# Patient Record
Sex: Male | Born: 1975 | Race: White | Hispanic: No | Marital: Married | Smoking: Never smoker
Health system: Southern US, Community
[De-identification: ages and names within clinical notes are randomized; demographics above are authoritative.]

## PROBLEM LIST (undated history)

## (undated) ENCOUNTER — Emergency Department (HOSPITAL_COMMUNITY): Admission: EM | Payer: BC Managed Care – PPO | Source: Home / Self Care

## (undated) DIAGNOSIS — K625 Hemorrhage of anus and rectum: Secondary | ICD-10-CM

## (undated) DIAGNOSIS — E079 Disorder of thyroid, unspecified: Secondary | ICD-10-CM

## (undated) DIAGNOSIS — K5150 Left sided colitis without complications: Secondary | ICD-10-CM

## (undated) DIAGNOSIS — E0590 Thyrotoxicosis, unspecified without thyrotoxic crisis or storm: Secondary | ICD-10-CM

## (undated) DIAGNOSIS — K449 Diaphragmatic hernia without obstruction or gangrene: Secondary | ICD-10-CM

## (undated) DIAGNOSIS — K219 Gastro-esophageal reflux disease without esophagitis: Secondary | ICD-10-CM

## (undated) HISTORY — DX: Disorder of thyroid, unspecified: E07.9

## (undated) HISTORY — PX: ACHILLES TENDON REPAIR: SUR1153

## (undated) HISTORY — PX: COLONOSCOPY: SHX174

## (undated) HISTORY — DX: Left sided colitis without complications: K51.50

## (undated) HISTORY — PX: SHOULDER SURGERY: SHX246

## (undated) HISTORY — DX: Thyrotoxicosis, unspecified without thyrotoxic crisis or storm: E05.90

## (undated) HISTORY — PX: KNEE ARTHROSCOPY: SUR90

## (undated) HISTORY — DX: Diaphragmatic hernia without obstruction or gangrene: K44.9

## (undated) HISTORY — DX: Gastro-esophageal reflux disease without esophagitis: K21.9

## (undated) HISTORY — DX: Hemorrhage of anus and rectum: K62.5

---

## 2005-03-15 ENCOUNTER — Encounter: Payer: Self-pay | Admitting: Gastroenterology

## 2005-12-13 ENCOUNTER — Encounter: Payer: Self-pay | Admitting: Gastroenterology

## 2007-01-16 ENCOUNTER — Encounter: Payer: Self-pay | Admitting: Gastroenterology

## 2007-03-13 ENCOUNTER — Encounter: Payer: Self-pay | Admitting: Gastroenterology

## 2007-04-18 ENCOUNTER — Encounter: Payer: Self-pay | Admitting: Gastroenterology

## 2007-10-01 ENCOUNTER — Telehealth: Payer: Self-pay | Admitting: Gastroenterology

## 2007-10-03 ENCOUNTER — Telehealth: Payer: Self-pay | Admitting: Gastroenterology

## 2007-10-24 DIAGNOSIS — K649 Unspecified hemorrhoids: Secondary | ICD-10-CM | POA: Insufficient documentation

## 2007-10-24 DIAGNOSIS — K5150 Left sided colitis without complications: Secondary | ICD-10-CM

## 2007-10-24 DIAGNOSIS — K449 Diaphragmatic hernia without obstruction or gangrene: Secondary | ICD-10-CM | POA: Insufficient documentation

## 2007-10-30 ENCOUNTER — Ambulatory Visit: Payer: Self-pay | Admitting: Gastroenterology

## 2007-11-06 LAB — CONVERTED CEMR LAB
AST: 47 units/L — ABNORMAL HIGH (ref 0–37)
Albumin: 4.5 g/dL (ref 3.5–5.2)
Alkaline Phosphatase: 45 units/L (ref 39–117)
BUN: 11 mg/dL (ref 6–23)
Basophils Absolute: 0.1 10*3/uL (ref 0.0–0.1)
Chloride: 101 meq/L (ref 96–112)
Eosinophils Absolute: 0.4 10*3/uL (ref 0.0–0.7)
Ferritin: 29.6 ng/mL (ref 22.0–322.0)
Folate: 11.9 ng/mL
GFR calc Af Amer: 101 mL/min
GFR calc non Af Amer: 84 mL/min
HCT: 44.6 % (ref 39.0–52.0)
MCHC: 35.3 g/dL (ref 30.0–36.0)
MCV: 92.9 fL (ref 78.0–100.0)
Monocytes Absolute: 0.4 10*3/uL (ref 0.1–1.0)
Neutrophils Relative %: 43.9 % (ref 43.0–77.0)
Platelets: 204 10*3/uL (ref 150–400)
Potassium: 4.1 meq/L (ref 3.5–5.1)
RDW: 11.8 % (ref 11.5–14.6)
Saturation Ratios: 40.1 % (ref 20.0–50.0)
Sed Rate: 6 mm/hr (ref 0–16)
Sodium: 138 meq/L (ref 135–145)
Total Bilirubin: 0.9 mg/dL (ref 0.3–1.2)
WBC: 4.8 10*3/uL (ref 4.5–10.5)

## 2007-11-12 ENCOUNTER — Encounter: Payer: Self-pay | Admitting: Gastroenterology

## 2007-11-12 ENCOUNTER — Ambulatory Visit: Payer: Self-pay | Admitting: Gastroenterology

## 2007-11-19 ENCOUNTER — Encounter (INDEPENDENT_AMBULATORY_CARE_PROVIDER_SITE_OTHER): Payer: Self-pay | Admitting: *Deleted

## 2007-12-14 ENCOUNTER — Ambulatory Visit: Payer: Self-pay | Admitting: Gastroenterology

## 2007-12-21 ENCOUNTER — Telehealth: Payer: Self-pay | Admitting: Gastroenterology

## 2008-01-02 ENCOUNTER — Ambulatory Visit: Payer: Self-pay | Admitting: Gastroenterology

## 2008-01-02 LAB — CONVERTED CEMR LAB
Basophils Absolute: 0.1 10*3/uL (ref 0.0–0.1)
Basophils Relative: 1.3 % (ref 0.0–3.0)
Eosinophils Absolute: 0.4 10*3/uL (ref 0.0–0.7)
Eosinophils Relative: 8.6 % — ABNORMAL HIGH (ref 0.0–5.0)
HCT: 43.9 % (ref 39.0–52.0)
MCHC: 35.3 g/dL (ref 30.0–36.0)
MCV: 93.1 fL (ref 78.0–100.0)
Monocytes Absolute: 0.3 10*3/uL (ref 0.1–1.0)
Platelets: 204 10*3/uL (ref 150–400)
RBC: 4.72 M/uL (ref 4.22–5.81)
WBC: 4.7 10*3/uL (ref 4.5–10.5)

## 2008-01-14 ENCOUNTER — Telehealth: Payer: Self-pay | Admitting: Gastroenterology

## 2008-01-18 ENCOUNTER — Ambulatory Visit: Payer: Self-pay | Admitting: Gastroenterology

## 2008-01-18 LAB — CONVERTED CEMR LAB
Basophils Absolute: 0.1 10*3/uL (ref 0.0–0.1)
Lymphocytes Relative: 36.9 % (ref 12.0–46.0)
MCHC: 34.8 g/dL (ref 30.0–36.0)
Monocytes Relative: 7.6 % (ref 3.0–12.0)
Neutrophils Relative %: 47.5 % (ref 43.0–77.0)
RDW: 12 % (ref 11.5–14.6)

## 2008-02-07 ENCOUNTER — Ambulatory Visit: Payer: Self-pay | Admitting: Gastroenterology

## 2008-02-07 LAB — CONVERTED CEMR LAB
ALT: 36 units/L (ref 0–53)
Albumin: 4.6 g/dL (ref 3.5–5.2)
Basophils Relative: 1 % (ref 0.0–3.0)
Bilirubin, Direct: 0.1 mg/dL (ref 0.0–0.3)
Eosinophils Relative: 6.6 % — ABNORMAL HIGH (ref 0.0–5.0)
HCT: 44.6 % (ref 39.0–52.0)
Hemoglobin: 15.8 g/dL (ref 13.0–17.0)
Monocytes Absolute: 0.4 10*3/uL (ref 0.1–1.0)
Monocytes Relative: 8.6 % (ref 3.0–12.0)
Neutro Abs: 2.3 10*3/uL (ref 1.4–7.7)
RBC: 4.73 M/uL (ref 4.22–5.81)
Total Protein: 7.7 g/dL (ref 6.0–8.3)
WBC: 4.6 10*3/uL (ref 4.5–10.5)

## 2008-03-24 ENCOUNTER — Ambulatory Visit: Payer: Self-pay | Admitting: Gastroenterology

## 2008-03-31 LAB — CONVERTED CEMR LAB
Alkaline Phosphatase: 46 units/L (ref 39–117)
Basophils Absolute: 0 10*3/uL (ref 0.0–0.1)
Bilirubin, Direct: 0.2 mg/dL (ref 0.0–0.3)
Eosinophils Absolute: 0.4 10*3/uL (ref 0.0–0.7)
Eosinophils Relative: 7.7 % — ABNORMAL HIGH (ref 0.0–5.0)
MCV: 96.1 fL (ref 78.0–100.0)
Neutrophils Relative %: 53.5 % (ref 43.0–77.0)
Platelets: 192 10*3/uL (ref 150–400)
Total Protein: 7.1 g/dL (ref 6.0–8.3)
WBC: 5.3 10*3/uL (ref 4.5–10.5)

## 2008-04-08 ENCOUNTER — Ambulatory Visit: Payer: Self-pay | Admitting: Gastroenterology

## 2008-04-14 ENCOUNTER — Telehealth: Payer: Self-pay | Admitting: Gastroenterology

## 2008-04-15 ENCOUNTER — Encounter: Payer: Self-pay | Admitting: Gastroenterology

## 2008-04-17 ENCOUNTER — Ambulatory Visit: Payer: Self-pay | Admitting: Gastroenterology

## 2008-04-23 ENCOUNTER — Ambulatory Visit: Payer: Self-pay | Admitting: Gastroenterology

## 2008-04-23 ENCOUNTER — Encounter: Payer: Self-pay | Admitting: Gastroenterology

## 2008-04-25 ENCOUNTER — Encounter: Payer: Self-pay | Admitting: Gastroenterology

## 2008-04-29 ENCOUNTER — Telehealth: Payer: Self-pay | Admitting: Gastroenterology

## 2008-05-01 ENCOUNTER — Ambulatory Visit: Payer: Self-pay | Admitting: Gastroenterology

## 2008-05-07 ENCOUNTER — Ambulatory Visit: Payer: Self-pay | Admitting: Gastroenterology

## 2008-05-07 ENCOUNTER — Telehealth: Payer: Self-pay | Admitting: Gastroenterology

## 2008-05-15 ENCOUNTER — Telehealth: Payer: Self-pay | Admitting: Gastroenterology

## 2008-06-09 ENCOUNTER — Ambulatory Visit: Payer: Self-pay | Admitting: Gastroenterology

## 2008-06-09 LAB — CONVERTED CEMR LAB
Albumin: 4 g/dL (ref 3.5–5.2)
Basophils Relative: 0.1 % (ref 0.0–3.0)
Eosinophils Absolute: 0 10*3/uL (ref 0.0–0.7)
Hemoglobin: 15.2 g/dL (ref 13.0–17.0)
Lymphs Abs: 0.5 10*3/uL — ABNORMAL LOW (ref 0.7–4.0)
MCHC: 35.3 g/dL (ref 30.0–36.0)
MCV: 98.4 fL (ref 78.0–100.0)
Monocytes Absolute: 0.2 10*3/uL (ref 0.1–1.0)
Neutro Abs: 4.3 10*3/uL (ref 1.4–7.7)
RBC: 4.38 M/uL (ref 4.22–5.81)
Total Protein: 6.7 g/dL (ref 6.0–8.3)

## 2008-06-12 ENCOUNTER — Ambulatory Visit: Payer: Self-pay | Admitting: Gastroenterology

## 2008-07-09 ENCOUNTER — Telehealth: Payer: Self-pay | Admitting: Gastroenterology

## 2008-07-11 ENCOUNTER — Telehealth: Payer: Self-pay | Admitting: Gastroenterology

## 2008-07-24 ENCOUNTER — Telehealth: Payer: Self-pay | Admitting: Gastroenterology

## 2008-08-11 ENCOUNTER — Telehealth: Payer: Self-pay | Admitting: Gastroenterology

## 2008-08-12 ENCOUNTER — Ambulatory Visit: Payer: Self-pay | Admitting: Gastroenterology

## 2008-08-12 DIAGNOSIS — K625 Hemorrhage of anus and rectum: Secondary | ICD-10-CM | POA: Insufficient documentation

## 2008-08-12 LAB — CONVERTED CEMR LAB
Basophils Absolute: 0.1 10*3/uL (ref 0.0–0.1)
Basophils Relative: 1.5 % (ref 0.0–3.0)
Eosinophils Absolute: 0.3 10*3/uL (ref 0.0–0.7)
Ferritin: 41.3 ng/mL (ref 22.0–322.0)
HCT: 42.8 % (ref 39.0–52.0)
Hemoglobin: 15.2 g/dL (ref 13.0–17.0)
Lymphs Abs: 1.1 10*3/uL (ref 0.7–4.0)
MCHC: 35.6 g/dL (ref 30.0–36.0)
Monocytes Relative: 11.7 % (ref 3.0–12.0)
Neutro Abs: 2.1 10*3/uL (ref 1.4–7.7)
RDW: 11.4 % — ABNORMAL LOW (ref 11.5–14.6)
Vitamin B-12: 718 pg/mL (ref 211–911)

## 2008-08-18 ENCOUNTER — Telehealth: Payer: Self-pay | Admitting: Gastroenterology

## 2008-08-19 ENCOUNTER — Encounter: Admission: RE | Admit: 2008-08-19 | Discharge: 2008-11-17 | Payer: Self-pay | Admitting: Gastroenterology

## 2008-08-19 ENCOUNTER — Encounter: Payer: Self-pay | Admitting: Gastroenterology

## 2008-08-30 ENCOUNTER — Encounter: Payer: Self-pay | Admitting: Gastroenterology

## 2008-09-16 ENCOUNTER — Ambulatory Visit: Payer: Self-pay | Admitting: Gastroenterology

## 2008-09-16 LAB — CONVERTED CEMR LAB
Albumin: 4 g/dL (ref 3.5–5.2)
Alkaline Phosphatase: 35 units/L — ABNORMAL LOW (ref 39–117)
Basophils Absolute: 0.1 10*3/uL (ref 0.0–0.1)
Eosinophils Absolute: 0.1 10*3/uL (ref 0.0–0.7)
Hemoglobin: 15.4 g/dL (ref 13.0–17.0)
Lymphocytes Relative: 40.2 % (ref 12.0–46.0)
Lymphs Abs: 2.4 10*3/uL (ref 0.7–4.0)
MCHC: 33.8 g/dL (ref 30.0–36.0)
MCV: 98.2 fL (ref 78.0–100.0)
Monocytes Absolute: 0.4 10*3/uL (ref 0.1–1.0)
Neutro Abs: 2.9 10*3/uL (ref 1.4–7.7)
RDW: 11.9 % (ref 11.5–14.6)
Sed Rate: 4 mm/hr (ref 0–22)
Total Protein: 6.7 g/dL (ref 6.0–8.3)

## 2008-10-09 ENCOUNTER — Ambulatory Visit: Payer: Self-pay | Admitting: Gastroenterology

## 2008-10-09 LAB — CONVERTED CEMR LAB
AST: 23 units/L (ref 0–37)
Albumin: 4.2 g/dL (ref 3.5–5.2)
Alkaline Phosphatase: 37 units/L — ABNORMAL LOW (ref 39–117)
Basophils Relative: 0.1 % (ref 0.0–3.0)
Bilirubin, Direct: 0.1 mg/dL (ref 0.0–0.3)
Eosinophils Relative: 0.6 % (ref 0.0–5.0)
Hemoglobin: 15.5 g/dL (ref 13.0–17.0)
Lymphocytes Relative: 18.6 % (ref 12.0–46.0)
Monocytes Relative: 4.1 % (ref 3.0–12.0)
Neutro Abs: 3.5 10*3/uL (ref 1.4–7.7)
Neutrophils Relative %: 76.6 % (ref 43.0–77.0)
RBC: 4.64 M/uL (ref 4.22–5.81)
Total Protein: 6.7 g/dL (ref 6.0–8.3)
WBC: 4.5 10*3/uL (ref 4.5–10.5)

## 2008-10-21 ENCOUNTER — Ambulatory Visit: Payer: Self-pay | Admitting: Gastroenterology

## 2008-10-22 LAB — CONVERTED CEMR LAB
Basophils Absolute: 0 10*3/uL (ref 0.0–0.1)
Basophils Relative: 0.7 % (ref 0.0–3.0)
Eosinophils Absolute: 0.1 10*3/uL (ref 0.0–0.7)
Lymphocytes Relative: 38.9 % (ref 12.0–46.0)
MCHC: 34.1 g/dL (ref 30.0–36.0)
MCV: 97.6 fL (ref 78.0–100.0)
Monocytes Absolute: 0.4 10*3/uL (ref 0.1–1.0)
Neutro Abs: 2.1 10*3/uL (ref 1.4–7.7)
Neutrophils Relative %: 48.5 % (ref 43.0–77.0)
RBC: 4.61 M/uL (ref 4.22–5.81)
RDW: 13 % (ref 11.5–14.6)

## 2008-11-10 ENCOUNTER — Telehealth: Payer: Self-pay | Admitting: Gastroenterology

## 2008-11-13 ENCOUNTER — Telehealth: Payer: Self-pay | Admitting: Gastroenterology

## 2008-12-22 ENCOUNTER — Ambulatory Visit: Payer: Self-pay | Admitting: Gastroenterology

## 2008-12-22 LAB — CONVERTED CEMR LAB
Basophils Absolute: 0.1 10*3/uL (ref 0.0–0.1)
Eosinophils Absolute: 0.1 10*3/uL (ref 0.0–0.7)
Hemoglobin: 15.2 g/dL (ref 13.0–17.0)
Lymphocytes Relative: 20.1 % (ref 12.0–46.0)
MCHC: 34.3 g/dL (ref 30.0–36.0)
Monocytes Relative: 6.4 % (ref 3.0–12.0)
Neutro Abs: 3.4 10*3/uL (ref 1.4–7.7)
Neutrophils Relative %: 69.2 % (ref 43.0–77.0)
RBC: 4.48 M/uL (ref 4.22–5.81)
RDW: 12.4 % (ref 11.5–14.6)

## 2008-12-23 ENCOUNTER — Ambulatory Visit: Payer: Self-pay | Admitting: Gastroenterology

## 2009-02-12 ENCOUNTER — Encounter (INDEPENDENT_AMBULATORY_CARE_PROVIDER_SITE_OTHER): Payer: Self-pay | Admitting: *Deleted

## 2009-03-04 ENCOUNTER — Ambulatory Visit: Payer: Self-pay | Admitting: Gastroenterology

## 2009-03-04 LAB — CONVERTED CEMR LAB
Basophils Absolute: 0 10*3/uL (ref 0.0–0.1)
Basophils Relative: 0.3 % (ref 0.0–3.0)
Eosinophils Relative: 0.5 % (ref 0.0–5.0)
HCT: 45.8 % (ref 39.0–52.0)
Hemoglobin: 15.4 g/dL (ref 13.0–17.0)
Lymphocytes Relative: 11.8 % — ABNORMAL LOW (ref 12.0–46.0)
Lymphs Abs: 0.9 10*3/uL (ref 0.7–4.0)
Monocytes Relative: 4.7 % (ref 3.0–12.0)
Neutro Abs: 6.2 10*3/uL (ref 1.4–7.7)
RBC: 4.51 M/uL (ref 4.22–5.81)

## 2009-03-06 ENCOUNTER — Ambulatory Visit: Payer: Self-pay | Admitting: Gastroenterology

## 2009-03-06 ENCOUNTER — Encounter (INDEPENDENT_AMBULATORY_CARE_PROVIDER_SITE_OTHER): Payer: Self-pay | Admitting: *Deleted

## 2009-03-11 ENCOUNTER — Telehealth: Payer: Self-pay | Admitting: Gastroenterology

## 2009-03-13 ENCOUNTER — Ambulatory Visit: Payer: Self-pay | Admitting: Gastroenterology

## 2009-03-17 ENCOUNTER — Encounter: Payer: Self-pay | Admitting: Gastroenterology

## 2009-03-19 ENCOUNTER — Telehealth: Payer: Self-pay | Admitting: Gastroenterology

## 2009-03-23 ENCOUNTER — Ambulatory Visit: Payer: Self-pay | Admitting: Gastroenterology

## 2009-04-13 ENCOUNTER — Telehealth: Payer: Self-pay | Admitting: Gastroenterology

## 2009-04-16 ENCOUNTER — Ambulatory Visit: Payer: Self-pay | Admitting: Family Medicine

## 2009-04-24 ENCOUNTER — Telehealth: Payer: Self-pay | Admitting: Gastroenterology

## 2009-04-28 ENCOUNTER — Telehealth: Payer: Self-pay | Admitting: Gastroenterology

## 2009-05-11 ENCOUNTER — Telehealth: Payer: Self-pay | Admitting: Gastroenterology

## 2009-06-24 ENCOUNTER — Telehealth: Payer: Self-pay | Admitting: Gastroenterology

## 2009-07-08 ENCOUNTER — Ambulatory Visit: Payer: Self-pay | Admitting: Gastroenterology

## 2009-07-08 LAB — CONVERTED CEMR LAB
ALT: 42 units/L (ref 0–53)
Albumin: 4.7 g/dL (ref 3.5–5.2)
BUN: 21 mg/dL (ref 6–23)
Basophils Absolute: 0 10*3/uL (ref 0.0–0.1)
Basophils Relative: 0.5 % (ref 0.0–3.0)
Bilirubin, Direct: 0.1 mg/dL (ref 0.0–0.3)
CO2: 32 meq/L (ref 19–32)
Calcium: 9.4 mg/dL (ref 8.4–10.5)
Chloride: 103 meq/L (ref 96–112)
Creatinine, Ser: 1 mg/dL (ref 0.4–1.5)
Eosinophils Absolute: 0.4 10*3/uL (ref 0.0–0.7)
Glucose, Bld: 111 mg/dL — ABNORMAL HIGH (ref 70–99)
Lymphocytes Relative: 25.9 % (ref 12.0–46.0)
MCHC: 34.8 g/dL (ref 30.0–36.0)
Monocytes Relative: 5 % (ref 3.0–12.0)
Neutrophils Relative %: 63 % (ref 43.0–77.0)
RBC: 4.54 M/uL (ref 4.22–5.81)
Sed Rate: 5 mm/hr (ref 0–22)
Total Protein: 7.2 g/dL (ref 6.0–8.3)

## 2009-07-10 ENCOUNTER — Ambulatory Visit: Payer: Self-pay | Admitting: Gastroenterology

## 2009-07-24 ENCOUNTER — Telehealth: Payer: Self-pay | Admitting: Gastroenterology

## 2009-09-30 ENCOUNTER — Ambulatory Visit: Payer: Self-pay | Admitting: Gastroenterology

## 2009-09-30 LAB — CONVERTED CEMR LAB
ALT: 36 units/L (ref 0–53)
AST: 27 units/L (ref 0–37)
Bilirubin, Direct: 0.2 mg/dL (ref 0.0–0.3)
Eosinophils Relative: 8.3 % — ABNORMAL HIGH (ref 0.0–5.0)
HCT: 45.6 % (ref 39.0–52.0)
Monocytes Relative: 8.4 % (ref 3.0–12.0)
Neutrophils Relative %: 46.3 % (ref 43.0–77.0)
Platelets: 162 10*3/uL (ref 150.0–400.0)
RBC: 4.61 M/uL (ref 4.22–5.81)
Total Bilirubin: 0.9 mg/dL (ref 0.3–1.2)
WBC: 4.6 10*3/uL (ref 4.5–10.5)

## 2009-10-01 ENCOUNTER — Ambulatory Visit: Payer: Self-pay | Admitting: Gastroenterology

## 2010-01-05 ENCOUNTER — Ambulatory Visit: Payer: Self-pay | Admitting: Gastroenterology

## 2010-01-05 LAB — CONVERTED CEMR LAB
Basophils Absolute: 0.1 10*3/uL (ref 0.0–0.1)
CRP, High Sensitivity: 0.21 (ref 0.00–5.00)
Eosinophils Relative: 9.4 % — ABNORMAL HIGH (ref 0.0–5.0)
HCT: 45.7 % (ref 39.0–52.0)
Lymphs Abs: 1.9 10*3/uL (ref 0.7–4.0)
MCHC: 34.9 g/dL (ref 30.0–36.0)
MCV: 97.7 fL (ref 78.0–100.0)
Monocytes Absolute: 0.5 10*3/uL (ref 0.1–1.0)
Platelets: 180 10*3/uL (ref 150.0–400.0)
RDW: 13 % (ref 11.5–14.6)

## 2010-03-02 NOTE — Assessment & Plan Note (Signed)
Summary: TB skin test. to start Remicade/dfs  Nurse Visit   Allergies: No Known Drug Allergies  Immunizations Administered:  PPD Skin Test:    Vaccine Type: PPD    Site: right forearm    Mfr: Sanofi Pasteur    Dose: 0.1 ml    Route: ID    Given by: [name] RN    Exp. Date: 04/28/2011    Lot #: [id number]  PPD Results    Date of reading: 03/25/2009    Results: < 5mm    Interpretation: negative  Orders Added: 1)  TB Skin Test [86580] 2)  Admin 1st Vaccine [82956]

## 2010-03-02 NOTE — Procedures (Signed)
Summary: Flexible Sigmoidoscopy  Patient: Bobby Medina Note: All result statuses are Final unless otherwise noted.  Tests: (1) Flexible Sigmoidoscopy (FLX)  FLX Flexible Sigmoidoscopy                             DONE     [facility]     [address]     [place], [place]  [address]           FLEXIBLE SIGMOIDOSCOPY PROCEDURE REPORT           PATIENT:  Bobby Medina, Bobby Medina  MR#:  [id number]     BIRTHDATE:  1976/01/26, 33 yrs. old  GENDER:  male           ENDOSCOPIST:  Vania Rea. Jarold Motto, MD, [facility]     Referred by:           PROCEDURE DATE:  03/13/2009     PROCEDURE:  Flexible Sigmoidoscopy with biopsy     ASA CLASS:  Class II     INDICATIONS:  ulcerative colitis REFRACTORY U.C.           MEDICATIONS:   Fentanyl 75 mcg IV, Versed 5 mg           DESCRIPTION OF PROCEDURE:   After the risks benefits and     alternatives of the procedure were thoroughly explained, informed     consent was obtained.  VERY TENDER EXAM. The [id number]     endoscope was introduced through the anus and advanced to the     descending colon, limited by poor preparation.   The quality of     the prep was poor.  The instrument was then slowly withdrawn as     the mucosa was fully examined.     <<PROCEDUREIMAGES>>           Colitis was found. ULCERATIONS AND EXUDATE AND MARKED FRIABILITY     DIFFUSELY.BX. DONE.   Retroflexed views in the rectum revealed not     performed.    The scope was then withdrawn from the patient and     the procedure terminated.           COMPLICATIONS:  None           ENDOSCOPIC IMPRESSION:     1) Colitis     2) Not performed     REFRACTORY ULCERATIVE COLITIS.R/O CMV,PMC,ETC.     RECOMMENDATIONS:     1) await biopsy results     WILL START REMICADE INFUSIONS ASAP.           REPEAT EXAM:  No           ______________________________     Vania Rea. Jarold Motto, MD, [name]           CC:           n.     eSIGNED:   Vania Rea. Michille Mcelrath at 03/13/2009 03:37 PM        Osvaldo Shipper, [id number]  Note: An exclamation mark (!) indicates a result that was not dispersed into the flowsheet. Document Creation Date: 03/13/2009 3:38 PM _______________________________________________________________________  (1) Order result status: Final Collection or observation date-time: 03/13/2009 15:28 Requested date-time:  Receipt date-time:  Reported date-time:  Referring Physician:   Ordering Physician: Sheryn Bison [phone]) Specimen Source:  Source: Launa Grill Order Number: [id number] Lab site:   Appended Document: Flexible Sigmoidoscopy    Clinical Lists Changes  Orders: Added  new Test order of Remicade Infusion (Remicade) - Signed

## 2010-03-02 NOTE — Progress Notes (Signed)
Summary: Question for nurse  Phone Note Call from Patient   Caller: [phone] [facility] spouse Call For: DR Brigitte Soderberg Summary of Call: Wants to speak to nurse  Initial call taken by: Leanor Kail [facility],  April 28, 2009 4:16 PM  Follow-up for Phone Call        Pt is trying to compare cost of getting remicade at the hospital out patient center vs getting it at a doctor's office.   Pt has appt at [facility] next week to see Dr. Dareen Piano re the remicade infusion.  They will be able to tell pt his cost after his visit.  Wife is asking if we can find out anything about cost of infusion at [facility] short stay.  She called the billing dept and was told that she will need a code for this information.   Morrie Sheldon, can you help with this? Follow-up by: [name] RN,  April 29, 2009 9:21 AM  Additional Follow-up for Phone Call Additional follow up Details #1::        Spoke with wife, Questions answered. Additional Follow-up by: Dwan Bolt,  April 29, 2009 10:59 AM

## 2010-03-02 NOTE — Assessment & Plan Note (Signed)
Summary: UC / JCS   Vital Signs:  Patient profile:   35 year old male Height:      75 inches Weight:      190.4 pounds  Primary Care Provider:  Tally Joe, MD    History of Present Illness: Assessment: new patient UC; GI doctor has recommended he start Remicade, following sigmoidoscopy that showed significant inflammation on the L side of his colon.  He and his wife Helmut Muster would like to try everything they can do before resorting to Remicade, which he will do if diet fails.  Mr Hessel has been following the "Maker's Diet" for about 3 weeks,  which limits processed foods in general and refined carb's specifically, with an emphasis on high-quality fats.  No artificial sweeteners.    Has not done food sensitivity testing.  GI symptoms, including loose stools, blood, and mucus, have improved in the past 3 wk; not sure if diet-related or coincidence, but encouraging.    Currently on 6-Mercaptopurine (6-MP) 75mg  daily, vit D 400 mg, & calcium.  Off prednisone x 3 weeks.  Since off prednisone, notes fatigue so has limited activity.  Still playing basketball every Sunday for 2 hr, however, and fatigue is nothing like previously experienced first time going off prednisone.    Nutrition Diagnosis: Inadequate energy intake (NI-1.4) related to energy requirements as evidenced by weight loss of  ~15 lb in past 5 wk, although weight is believed to be stabilizing, and pt is adding more foods back to diet weekly.  Patient is making progress in inappropriate intake of types of carbohydrate (NI-53.3) related to sugar foods as evidenced by elimination in past 3 wk of such foods.    Intervention: See Patient Instructions.    Monitoring/Eval: Patient will keep [place] posted as to progress, and will call w/ Qs as they arise.    Allergies: No Known Drug Allergies   Impression & Recommendations:  Problem # 1:  ULCERATIVE COLITIS, LEFT SIDED (ICD-556.5) >40 minutes spent [facility].  off prednisone x 3  wks.  Orders: [facility]- Est  Level 4 (99214)  Complete Medication List: 1)  Mercaptopurine 50 Mg Tabs (Mercaptopurine) .... Take 1 and a half tablets by mouth once daily 2)  Prednisone 5 Mg Tabs (Prednisone) .... As directed 3)  Multivitamins Tabs (Multiple vitamin) .... Once daily  Patient Instructions: 1)  There is not good research to strongly recommend any type of diet for Ulcerative Colitis.  However, if a diet is working for you, as long as it is healthy/balanced, it is safe to recommend continuing. 2)  Stick to whole real foods. 3)  Low-sugar diet, low-glycemic index foods. 4)  High-quality fats - olive oil, nut and seed oils in moderation. 5)  Adequate calories. 6)  Low-fiber diet when in a flare, otherwise high-fiber diet will be better in the long run. 7)  Supplements: OMEGA 3 FAT, VITAMIN D 1000 IU (pending vit D lab results; if adequate levels, 600 IU), CALCIUM 8)  Possibly down the road - probiotics. 9)  Check your vitamin D level and call me.

## 2010-03-02 NOTE — Assessment & Plan Note (Signed)
Summary: Recheck, dfs   History of Present Illness Primary GI MD: Sheryn Bison MD FACP FAGA Primary Provider: Tally Joe, MD  Requesting Provider: n/a Chief Complaint: f/u colitis and remicade treatments. Pt states he doing very well. He had some nausea towards the end of his last treatment but it went away but no other symptoms.  History of Present Illness:   35 year old Caucasian male with diffuse altered colitis unresponsive to standard therapy including corticosteroids and 6-mercaptopurine. He began Remicade infusions and has completed his third infusion with his initiation therapy. He says he is off of prednisone at this time and is taking 6-MP 75 mg a day. He has had some mild arthralgias and low grade fever with the infusions but otherwise is doing well and denies crampy abdominal pain, diarrhea, or rectal bleeding. He is on a non-processed food diet and has lost 20 pounds in weight. He denies any systemic complaints. His had no recurrent infections, arthritis, skin rashes, or oral stomatitis.   GI Review of Systems      Denies abdominal pain, acid reflux, belching, bloating, chest pain, dysphagia with liquids, dysphagia with solids, heartburn, loss of appetite, nausea, vomiting, vomiting blood, weight loss, and  weight gain.        Denies anal fissure, black tarry stools, change in bowel habit, constipation, diarrhea, diverticulosis, fecal incontinence, heme positive stool, hemorrhoids, irritable bowel syndrome, jaundice, light color stool, liver problems, rectal bleeding, and  rectal pain.    Current Medications (verified): 1)  Mercaptopurine 50 Mg Tabs (Mercaptopurine) .... Take 1 and A Half Tablets By Mouth Once Daily 2)  Remicade 100 Mg Solr (Infliximab) .... Just Finished 3rd Treatment. Next in 8 Weeks 3)  Vitamin D 1000 Unit Tabs (Cholecalciferol) .... One Tablet By Mouth Once Daily  Allergies (verified): No Known Drug Allergies  Past History:  Past medical, surgical,  family and social histories (including risk factors) reviewed for relevance to current acute and chronic problems.  Past Medical History: Reviewed history from 08/12/2008 and no changes required. 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 Knee Arthroscopy  Family History: Reviewed history from 04/08/2008 and no changes required. Family History of Heart Disease: Grandfather (maternal) No FH of Colon Cancer:  Social History: Reviewed history from 10/30/2007 and no changes required. Patient has never smoked.  Alcohol Use - no Occupation: Management consultant Drug Use - no Patient gets regular exercise.  Review of Systems  The patient denies allergy/sinus, anemia, anxiety-new, arthritis/joint pain, back pain, blood in urine, breast changes/lumps, change in vision, confusion, cough, coughing up blood, depression-new, fainting, fatigue, fever, headaches-new, hearing problems, heart murmur, heart rhythm changes, itching, menstrual pain, muscle pains/cramps, night sweats, nosebleeds, pregnancy symptoms, shortness of breath, skin rash, sleeping problems, sore throat, swelling of feet/legs, swollen lymph glands, thirst - excessive , urination - excessive , urination changes/pain, urine leakage, vision changes, and voice change.    Vital Signs:  Patient profile:   35 year old male Height:      75 inches Weight:      186.13 pounds BMI:     23.35 Pulse rate:   66 / minute Pulse rhythm:   regular BP sitting:   124 / 72  (right arm) Cuff size:   regular  Vitals Entered By: Christie Nottingham CMA Duncan Dull) (July 10, 2009 8:51 AM)  Physical Exam  General:  Well  developed, well nourished, no acute distress.healthy appearing.   Head:  Normocephalic and atraumatic. Eyes:  PERRLA, no icterus.exam deferred to  patient's ophthalmologist.   Mouth:  No deformity or lesions, dentition normal. Neck:  Supple; no masses or thyromegaly. Lungs:  Clear throughout to auscultation. Heart:  Regular rate and rhythm; no murmurs, rubs,  or bruits. Abdomen:  Soft, nontender and nondistended. No masses, hepatosplenomegaly or hernias noted. Normal bowel sounds. Extremities:  No clubbing, cyanosis, edema or deformities noted. Neurologic:  Alert and  oriented x4;  grossly normal neurologically. Cervical Nodes:  No significant cervical adenopathy. Psych:  Alert and cooperative. Normal mood and affect.   Impression & Recommendations:  Problem # 1:  ULCERATIVE COLITIS, LEFT SIDED (ICD-556.5) Assessment Improved Continue 6-MP 75 mg a day and Remicade infusions at current bases every 8 weeks. He takes Benadryl and Zantac before his infusions, and I have suggested possible Tylenol in standard bases or arthralgias. Review of recent blood work shows normal CBC, sedimentation rate, and metabolic profile. We will check him back in 3 months time and repeat his labs and consider discontinuing 6-MP therapy pending on his clinical course and response.  Problem # 2:  RECTAL BLEEDING (ICD-569.3) Assessment: Improved  Problem # 3:  HEMORRHOIDS (ICD-455.6) Assessment: Improved  Patient Instructions: 1)  Please continue current medications.  2)  Please schedule a follow-up appointment in 3 months. 3)  Please come in a few days before OV for lab work. 4)  The medication list was reviewed and reconciled.  All changed / newly prescribed medications were explained.  A complete medication list was provided to the patient / caregiver. 5)  Copy sent to : Dr. Tally Joe

## 2010-03-02 NOTE — Progress Notes (Signed)
Summary: TB skin test   Phone Note Call from Patient Call back at [phone]   Caller: wife, Elease Hashimoto Call For: Dr. Jarold Motto Reason for Call: Talk to Nurse Summary of Call: wife has questions regarding pt's TB skin test... did not want to go into further detail with me Initial call taken by: Vallarie Mare,  March 19, 2009 1:43 PM  Follow-up for Phone Call        Spoke with wife.  Pt has decided to go ahead and start the process for taking the remicade.  Will come by Monday 03/23/09 for Tb skin test.   Wife asks if pt has ever been tested for celiac and lactose interolance.  Celiac screen was done in 2009.   Wife informed of this.  She asks if there is a test for lactose intolerance? If so can pt have this done? Follow-up by: [name] RN,  March 19, 2009 3:16 PM  Additional Follow-up for Phone Call Additional follow up Details #1::        no Additional Follow-up by: [name] FACG,  March 20, 2009 8:37 AM    Additional Follow-up for Phone Call Additional follow up Details #2::    Had long conversation with patient.  He does not want referral.  He does not want allergy testing.  Pt wants to go ahead and begin the process for starting Remicaid.  TB test applied today.  Pt will return on Wed to have it read.   Will check with pt's insurance re coverage for remicaid. Follow-up by: [name] RN,  March 23, 2009 2:32 PM

## 2010-03-02 NOTE — Letter (Signed)
Summary: Office Visit Letter  [facility]  [address] [place], [place], [place] [address]   Phone: [phone]  Fax: [phone]      February 12, 2009 MRN: [id number]   [facility] [address] [place], [place]  [address]   Dear Mr. FAISON,   According to our records, it is time for you to schedule a follow-up office visit with [place].   At your convenience, please call [phone] (option #2)to schedule an office visit. If you have any questions, concerns, or feel that this letter is in error, we would appreciate your call.   Sincerely,  Vania Rea. Jarold Motto, M.D.  [facility] [phone]

## 2010-03-02 NOTE — Assessment & Plan Note (Signed)
Summary: 35-MONTH F/U APPT...LSW.   History of Present Illness Primary GI MD: Sheryn Bison MD FACP FAGA Primary Provider: Tally Joe, MD  Requesting Provider: n/a Chief Complaint: f/u ulcerative colitis and remicade treatments. Pt states he is doing very well.  History of Present Illness:   He denies abdominal pain, diarrhea, rectal bleeding, or systemic complaints. Lab data has all been normal. He takes 6-MP 75 mg a day Remicade infusions every 3 months.   GI Review of Systems      Denies abdominal pain, acid reflux, belching, bloating, chest pain, dysphagia with liquids, dysphagia with solids, heartburn, loss of appetite, nausea, vomiting, vomiting blood, weight loss, and  weight gain.        Denies anal fissure, black tarry stools, change in bowel habit, constipation, diarrhea, diverticulosis, fecal incontinence, heme positive stool, hemorrhoids, irritable bowel syndrome, jaundice, light color stool, liver problems, rectal bleeding, and  rectal pain.    Current Medications (verified): 1)  Mercaptopurine 50 Mg Tabs (Mercaptopurine) .... Take 1 and A Half Tablets By Mouth Once Daily 2)  Remicade 100 Mg Solr (Infliximab) .... Every 8 Weeks 3)  Vitamin D 1000 Unit Tabs (Cholecalciferol) .... One Tablet By Mouth Once Daily  Allergies (verified): No Known Drug Allergies  Past History:  Past medical, surgical, family and social histories (including risk factors) reviewed for relevance to current acute and chronic problems.  Past Medical History: Reviewed history from 08/12/2008 and no changes required. 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 Knee Arthroscopy  Family History: Reviewed history from 04/08/2008 and no changes required. Family History of Heart Disease: Grandfather  (maternal) No FH of Colon Cancer:  Social History: Reviewed history from 10/30/2007 and no changes required. Patient has never smoked.  Alcohol Use - no Occupation: Management consultant Drug Use - no Patient gets regular exercise.  Review of Systems  The patient denies allergy/sinus, anemia, anxiety-new, arthritis/joint pain, back pain, blood in urine, breast changes/lumps, change in vision, confusion, cough, coughing up blood, depression-new, fainting, fatigue, fever, headaches-new, hearing problems, heart murmur, heart rhythm changes, itching, menstrual pain, muscle pains/cramps, night sweats, nosebleeds, pregnancy symptoms, shortness of breath, skin rash, sleeping problems, sore throat, swelling of feet/legs, swollen lymph glands, thirst - excessive , urination - excessive , urination changes/pain, urine leakage, vision changes, and voice change.    Vital Signs:  Patient profile:   35 year old male Height:      75 inches Weight:      195.13 pounds BMI:     24.48 Pulse rate:   70 / minute Pulse rhythm:   regular BP sitting:   122 / 78  (right arm) Cuff size:   regular  Vitals Entered By: Christie Nottingham CMA Duncan Dull) (October 01, 2009 9:47 AM)  Physical Exam  General:  Well developed, well nourished, no acute distress.healthy appearing.   Head:  Normocephalic and atraumatic. Eyes:  PERRLA, no icterus.exam deferred to patient's ophthalmologist.   Lungs:  Clear throughout to auscultation. Heart:  Regular rate and rhythm; no murmurs, rubs,  or bruits. Abdomen:  Soft, nontender and nondistended. No masses, hepatosplenomegaly or hernias noted. Normal bowel sounds. Extremities:  No clubbing, cyanosis, edema or deformities noted. Neurologic:  Alert and  oriented x4;  grossly normal neurologically. Psych:  Alert and cooperative. Normal mood and affect.   Impression & Recommendations:  Problem # 1:  ULCERATIVE COLITIS, LEFT SIDED (ICD-556.5) Assessment Improved Continue  current regime with followup CBC in 3 months in followup office visit in 6 months. He of course is to call should he have a relapse of his symptoms.  Problem # 2:  HEMORRHOIDS (ICD-455.6) Assessment: Improved  Patient Instructions: 1)  Copy sent to : Tally Joe, MD  2)  The medication list was reviewed and reconciled.  All changed / newly prescribed medications were explained.  A complete medication list was provided to the patient / caregiver. 3)  Please continue current medications.  4)  Please schedule a follow-up appointment in 6 months.

## 2010-03-02 NOTE — Letter (Signed)
Summary: [facility]  [address] [place], [place] [address]   Phone: [phone]  Fax: [phone]       Unknown Bobby Medina    02/19/1975    MRN: [id number]        Procedure Day /Date: Friday, 03/13/09     Arrival Time: 2:30      Procedure Time: 3:30     Location of Procedure:                    _ _  [facility] (4th Floor)    PREPARATION FOR FLEXIBLE SIGMOIDOSCOPY WITH MAGNESIUM CITRATE  Prior to the day before your procedure, purchase one 8 oz. bottle of Magnesium Citrate and one Fleet Enema from the laxative section of your drugstore.  _________________________________________________________________________________________________  THE DAY BEFORE YOUR PROCEDURE             DATE: 03/12/09      DAY: Thursday  1.   Have a clear liquid dinner the night before your procedure.  2.   Do not drink anything colored red or purple.  Avoid juices with pulp.  No orange juice.              CLEAR LIQUIDS INCLUDE: Water Jello Ice Popsicles Tea (sugar ok, no milk/cream) Powdered fruit flavored drinks Coffee (sugar ok, no milk/cream) Gatorade Juice: apple, white grape, white cranberry  Lemonade Clear bullion, consomm, broth Carbonated beverages (any kind) Strained chicken noodle soup Hard Candy   3.   At 7:00 pm the night before your procedure, drink one bottle of Magnesium Citrate over ice.  4.   Drink at least 3 more glasses of clear liquids before bedtime (preferably juices).  5.   Results are expected usually within 1 to 6 hours after taking the Magnesium Citrate.  ___________________________________________________________________________________________________  THE DAY OF YOUR PROCEDURE            DATE:  03/13/09   DAY: Friday  1.   Use Fleet Enema one hour prior to coming for procedure.  2.   You may drink clear liquids until 1:30 (2 hours before exam)       MEDICATION INSTRUCTIONS  Unless otherwise instructed,  you should take regular prescription medications with a small sip of water as early as possible the morning of your procedure.                 OTHER INSTRUCTIONS  You will need a responsible adult at least 33 years of age to accompany you and drive you home.   This person must remain in the waiting room during your procedure.  Wear loose fitting clothing that is easily removed.  Leave jewelry and other valuables at home.  However, you may wish to bring a book to read or an iPod/MP3 player to listen to music as you wait for your procedure to start.  Remove all body piercing jewelry and leave at home.  Total time from sign-in until discharge is approximately 2-3 hours.  You should go home directly after your procedure and rest.  You can resume normal activities the day after your procedure.  The day of your procedure you should not:   Drive   Make legal decisions   Operate machinery   Drink alcohol   Return to work  You will receive specific instructions about eating, activities and medications before you leave.   The above instructions have been reviewed and explained to me by  _______________________    I fully understand and can verbalize these instructions _____________________________ Date _________

## 2010-03-02 NOTE — Progress Notes (Signed)
Summary: Remicade  Phone Note Call from Patient Call back at [phone]   Caller: wife,[name] Call For: Dr. Jarold Motto Reason for Call: Talk to Nurse Summary of Call: would like to know more about how Remicade is administered Initial call taken by: Vallarie Mare,  March 11, 2009 11:09 AM  Follow-up for Phone Call        Left message. [name] RN  March 11, 2009 11:46 AM  Pt having proc this pm. [name] RN  March 13, 2009 2:37 PM  Pt needs start Remicade infisions per Dr. Jarold Motto.  5 mg /Kg. at weeks 0,2,6 then q 8 weeks.  Please notify pt next week per Dr. Jarold Motto. Follow-up by: [name] RN,  March 13, 2009 4:30 PM  Additional Follow-up for Phone Call Additional follow up Details #1::        Talked with pt's wife.  Pt and wife have some questions re Remicade.  Before starting treatments pt is interested in trying a drastic diet change.  Has read information re this and would like to put off Remicade until he tries this for a couple of weeks.  Pt will come by for up to date TB skin test and does want [place] to go ahead and see about insurance coverage for the remicade.  Also pt is interested in being tested for food allergys.   Pt is willing to come for OV if Dr. Jarold Motto wants to talk to pt re these things.   Additional Follow-up by: [name] RN,  March 16, 2009 9:39 AM    Additional Follow-up for Phone Call Additional follow up Details #2::    HE NEEDS ANOTHER GI DOC...I DO NOT APPROVE OF HOLISTIC TREATMENTS HERE AND I DISCUSSED THIS IN DETAIL WITH HE AND HIS WIFE AND EVEN HAD SECOND OPINION PER DR. PLEVY AT [place].SHE CAN CHOOSE , BUT I FEEL IT IS BEST IF THEY SEEK CARE WITH ANOTHER GI DOCTOR....DRP Follow-up by: [name] [name],  March 16, 2009 10:30 AM  Additional Follow-up for Phone Call Additional follow up Details #3:: Details for Additional Follow-up Action Taken: Talked with wife.  She will discuss with pt and call us  back re decision wheather to start remicaid or go for referral. Additional Follow-up by: [name] RN,  March 18, 2009 9:50 AM

## 2010-03-02 NOTE — Progress Notes (Signed)
Summary: speak to nurse  Phone Note Call from Patient Call back at [phone]   Caller: Patient Call For: Jarold Motto Reason for Call: Talk to Nurse Summary of Call: Patient wants to speak to nurse regarding TB test records, states that office we reffered him to for his Remicade does not have the records yet. Initial call taken by: Tawni Levy,  May 11, 2009 12:14 PM  Follow-up for Phone Call        Needs recent TB skin test results faxed to [facility].   Done as requested. Follow-up by: [name] RN,  May 11, 2009 12:40 PM

## 2010-03-02 NOTE — Progress Notes (Signed)
Summary: ? referral   Phone Note Call from Patient Call back at [phone]   Caller: Bobby Medina  Call For: Bobby Medina  Reason for Call: Talk to Nurse, Referral Summary of Call: needs a referral from nutritionist  Initial call taken by: Guadlupe [facility],  July 24, 2008 12:54 PM  Follow-up for Phone Call        refaxed records and left message on patients wifes VM that I refaxed notes and gave her the number for nutriation management.  Follow-up by: Harlow Mares CMA,  July 24, 2008 2:55 PM

## 2010-03-02 NOTE — Progress Notes (Signed)
Summary: triage  Phone Note Call from Patient Call back at [phone]   Caller: Patient Call For: Dr. Jarold Motto Reason for Call: Talk to Nurse Summary of Call: reporting flu-like symptoms once a month Initial call taken by: Vallarie Mare,  Jun 24, 2009 10:28 AM  Follow-up for Phone Call        PREPt is taking Remicade at Rematology office.  Dr. Dareen Piano.  He has had 2 infusions and is sur of the third one on 07/09/09.  States feels great.  Bowels are normal.  No bleeding or dairrhea.  Pt was instructed by Dr. Dareen Piano to see Dr Jarold Motto after 3rd infusion.    Pt states that he has experienced several occasions where he feels chilled and has body aches.  This occurred twice before starting remicade.  Happened again last week, symptoms only lasted a few hours then felt normal.   Did not check temp last week, but during the second spell temp was 100.  Pt is on 6 mp 75 mg also. Follow-up by: [name] RN,  Jun 24, 2009 11:07 AM  Additional Follow-up for Phone Call Additional follow up Details #1::        ADVIL 800MG  BEFORE INFUSION IS OK Additional Follow-up by: [name] [name],  Jun 24, 2009 11:32 AM    Additional Follow-up for Phone Call Additional follow up Details #2::    Pt notified.   Follow up appt sch,  Does pt need lab prior to visit?  If so what labs are needed? Follow-up by: [name] RN,  Jun 24, 2009 11:51 AM  Additional Follow-up for Phone Call Additional follow up Details #3:: Details for Additional Follow-up Action Taken:  [employer] AND SR Additional Follow-up by: [name] [name],  Jun 24, 2009 12:19 PM

## 2010-03-02 NOTE — Assessment & Plan Note (Signed)
Summary: 3 MO F/U.Marland KitchenMarland KitchenAS.   History of Present Illness Visit Type: Follow-up Visit Primary GI MD: Sheryn Bison MD FACP FAGA Primary Provider: Tally Joe, MD  Requesting Provider: n/a Chief Complaint: F/u for ulcerative colitis Pt c/o loose stools and BRB in stool after BMs History of Present Illness:   Audrick has never been able totally dull of his prednisone and is currently on 10 mg a day caused continued mucus and heme with his morning bowel movement. We have adjusted his 6-MP dosage to 75 mg a day per his CBC results. He generally feels well and denies systemic complaints and is having 1-2 soft bowel movements a day without abdominal pain, fever, chills, skin rashes, joint pains, mouth sores etc. he received a promotion at work, continues to workout daily, and generally feels fairly good but fatigue He denies cardiopulmonary problems or recurrent infections, he did not have the flu this year. Previous tuberculin skin test was negative   GI Review of Systems      Denies abdominal pain, acid reflux, belching, bloating, chest pain, dysphagia with liquids, dysphagia with solids, heartburn, loss of appetite, nausea, vomiting, vomiting blood, weight loss, and  weight gain.      Reports rectal bleeding.     Denies anal fissure, black tarry stools, change in bowel habit, constipation, diarrhea, diverticulosis, fecal incontinence, heme positive stool, hemorrhoids, irritable bowel syndrome, jaundice, light color stool, liver problems, and  rectal pain.    Current Medications (verified): 1)  Mercaptopurine 50 Mg Tabs (Mercaptopurine) .... Take 1 and A Half Tablets By Mouth Once Daily 2)  Prednisone 5 Mg Tabs (Prednisone) .... As Directed 3)  Multivitamins   Tabs (Multiple Vitamin) .... Once Daily  Allergies (verified): No Known Drug Allergies  Past History:  Past medical, surgical, family and social histories (including risk factors) reviewed for relevance to current acute and chronic  problems.  Past Medical History: Reviewed history from 08/12/2008 and no changes required. 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 Knee Arthroscopy  Family History: Reviewed history from 04/08/2008 and no changes required. Family History of Heart Disease: Grandfather (maternal) No FH of Colon Cancer:  Social History: Reviewed history from 10/30/2007 and no changes required. Patient has never smoked.  Alcohol Use - no Occupation: Management consultant Drug Use - no Patient gets regular exercise.  Review of Systems       The patient complains of fatigue.  The patient denies allergy/sinus, anemia, anxiety-new, arthritis/joint pain, back pain, blood in urine, breast changes/lumps, change in vision, confusion, cough, coughing up blood, depression-new, fainting, fever, headaches-new, hearing problems, heart murmur, heart rhythm changes, itching, muscle pains/cramps, night sweats, nosebleeds, shortness of breath, skin rash, sleeping problems, sore throat, swelling of feet/legs, swollen lymph glands, thirst - excessive, urination - excessive, urination changes/pain, urine leakage, vision changes, and voice change.    Vital Signs:  Patient profile:   35 year old male Height:      75 inches Weight:      206 pounds BMI:     25.84 BSA:     2.22 Pulse rate:   76 / minute Pulse rhythm:   regular BP sitting:   112 / 68  (left arm) Cuff size:   regular  Vitals Entered By: Ok Anis CMA (March 06, 2009 9:43 AM)  Physical Exam  General:  Well developed, well nourished,  no acute distress.healthy appearing.   Head:  Normocephalic and atraumatic. Eyes:  PERRLA, no icterus.exam deferred to patient's ophthalmologist.   Lungs:  Clear throughout to auscultation. Heart:  Regular rate and rhythm; no  murmurs, rubs,  or bruits. Abdomen:  Soft, nontender and nondistended. No masses, hepatosplenomegaly or hernias noted. Normal bowel sounds. Rectal:  Normal exam.Digital exam is unremarkable but there is blood and mucus in the rectal vault.There is a posterior lateral external hemorrhoid visible which is nonbleeding. I cannot appreciate fissures or fistulae. Extremities:  No clubbing, cyanosis, edema or deformities noted. Neurologic:  Alert and  oriented x4;  grossly normal neurologically. Inguinal Nodes:  No significant inguinal adenopathy. Psych:  Alert and cooperative. Normal mood and affect.   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 Previous treatment with oral and topical amino salicylates resulted in mucosal hypersensitivity. He has been seen in consultation by Dr. Clovis Cao at [facility] in [place] for his refractory colitis. Dr. Karel Jarvis is suggested Remicade infusion should be continued to be symptomatic with active colitis. I have scheduled flexible sigmoidoscopy exam next week and we'll proceed accordingly. I given him information concerning Remicade infusions for review. For now we will continue all medications as listed in his chart.  Orders: Flex with Sedation (Flex w/Sed)  Problem # 2:  HIATAL HERNIA (ICD-553.3) Assessment: Improved No Upper gastrointestinal or hepatobiliary complaints at this time.  Patient Instructions: 1)  Copy sent to : Dr. Tally Joe 2)  Please continue current medications.  3)  Colonoscopy and Flexible Sigmoidoscopy brochure given.  4)  Conscious Sedation brochure given.  5)  Flex sig scheduled.  6)  Information given re Remicade. 7)  The medication list was reviewed and reconciled.  All changed / newly prescribed medications were explained.  A complete medication list was provided  to the patient / caregiver.

## 2010-03-02 NOTE — Progress Notes (Signed)
Summary: refill  Phone Note Call from Patient Call back at [phone]   Caller: Spouse [name] Call For: Bobby Medina Reason for Call: Talk to Nurse Summary of Call: Patient wants update to his rx refills for 6mp states that they're gong out of town tomorrow and they need refill called in today. Initial call taken by: Tawni Levy,  July 24, 2009 12:26 PM  Follow-up for Phone Call        LM for wife that Rx has been sent to pharmacy. Follow-up by: [name] RN,  July 24, 2009 12:57 PM    Prescriptions: MERCAPTOPURINE 50 MG TABS (MERCAPTOPURINE) take 1 and a half tablets by mouth once daily  #60.0 Each x 6   Entered by:   [name] RN   Authorized by:   [name] [facility]   Signed by:   [name] RN on 07/24/2009   Method used:   Electronically to        [facility] [address] [id number] (retail)       [address]       [place], [place]  [address]       Ph: [phone]       Fax: [phone]   RxID:   [id number]

## 2010-03-02 NOTE — Progress Notes (Signed)
Summary: Talk about Remocade tx with nurse  Phone Note Call from Patient   Caller: Elease Hashimoto -spouse [phone] Call For: Dr  Jarold Motto Summary of Call: Wants to talk about the remicade treatment with nurse please Initial call taken by: Leanor Kail [facility],  April 13, 2009 3:25 PM  Follow-up for Phone Call        talked with pt's wife.  They have checked with insurance re the amt that pt will have to pay to get the remicaid.  Pt asks if there is a faciity that gives IV meds instead of a hospital.  He would have to pay less out of pocket if not given in hospital setting.   Follow-up by: [name] RN,  April 13, 2009 3:43 PM  Additional Follow-up for Phone Call Additional follow up Details #1::        Remicade can be given at [facility].  Pt would need to have visit with rheumatologist first.  Dr. Dareen Piano or Dr. Mallie Mussel.  fax records to [phone], their office will verify insurance coverage and sch OV for pt.   [name] RN  April 13, 2009 4:08 PM    Additional Follow-up for Phone Call Additional follow up Details #2::    Wife notified.  She will discuss with pt and call us back if pt wants to proceed with Remicaid. Follow-up by: [name] RN,  April 14, 2009 2:38 PM

## 2010-03-02 NOTE — Progress Notes (Signed)
Summary: Needs a phone number from [facility]  Phone Note Call from Patient   Caller: Dianne Dun [phone] Call For: Dr Jarold Motto Reason for Call: Talk to Nurse Summary of Call: Needs phone number for [facility] that administers the medicine  Initial call taken by: Leanor Kail [facility],  April 24, 2009 3:56 PM  Follow-up for Phone Call        Pt is trying to compare cost of getting Remicade at [facility] short stay and at [facility] inter med.  REq that we go ahead anf fax records to [facility] for their review.   Also req phone number for short stay at [facility].  Number given. Follow-up by: [name] RN,  April 24, 2009 4:06 PM  Additional Follow-up for Phone Call Additional follow up Details #1::        Records faxed. Additional Follow-up by: [name] RN,  April 24, 2009 4:12 PM

## 2010-03-02 NOTE — Letter (Signed)
Summary: Patient Notice- Colon Biospy Results  [facility]  [address] [place], [place] [address]   Phone: [phone]  Fax: [phone]        March 17, 2009 MRN: [id number]    [facility] [address] [place], [place]  [address]    Dear Mr. SOULLIERE,  I am pleased to inform you that the biopsies taken during your recent colonoscopy did not show any evidence of cancer upon pathologic examination.  Additional information/recommendations:  __No further action is needed at this time.  Please follow-up with      your primary care physician for your other healthcare needs.  __Please call [phone] to schedule a return visit to review      your condition.  x__Continue with the treatment plan as outlined on the day of your      exam.  __You should have a repeat colonoscopy examination for this problem           in _ years.  Please call us if you are having persistent problems or have questions about your condition that have not been fully answered at this time.  Sincerely,  [name] [facility]   This letter has been electronically signed by your physician.  Appended Document: Patient Notice- Colon Biospy Results Letter mailed 2.16.11

## 2010-03-10 ENCOUNTER — Encounter: Payer: Self-pay | Admitting: *Deleted

## 2010-03-19 ENCOUNTER — Encounter: Payer: Self-pay | Admitting: Gastroenterology

## 2010-03-19 ENCOUNTER — Encounter (INDEPENDENT_AMBULATORY_CARE_PROVIDER_SITE_OTHER): Payer: BC Managed Care – PPO

## 2010-03-19 DIAGNOSIS — K5150 Left sided colitis without complications: Secondary | ICD-10-CM

## 2010-03-30 NOTE — Assessment & Plan Note (Signed)
Summary: annual tb for remicade/lk  Nurse Visit   Allergies: No Known Drug Allergies  Immunizations Administered:  PPD Skin Test:    Vaccine Type: PPD    Site: left forearm    Mfr: Sanofi Pasteur    Dose: 0.05 ml    Route: ID    Given by: Chales Abrahams CMA (AAMA)    Exp. Date: 08/13/2011    Lot #: [id number]  PPD Results    Date of reading: 03/22/2010    Results: < 5mm    Interpretation: negative  Orders Added: 1)  TB Skin Test [86580] 2)  Admin 1st Vaccine [90471]  Appended Document: annual tb for remicade/lk printed and faxed to Dr. Pearson Grippe office .

## 2010-04-06 ENCOUNTER — Other Ambulatory Visit: Payer: BC Managed Care – PPO

## 2010-04-07 ENCOUNTER — Encounter: Payer: Self-pay | Admitting: Gastroenterology

## 2010-04-12 ENCOUNTER — Other Ambulatory Visit: Payer: Self-pay | Admitting: Gastroenterology

## 2010-04-12 ENCOUNTER — Other Ambulatory Visit: Payer: BC Managed Care – PPO

## 2010-04-12 ENCOUNTER — Encounter (INDEPENDENT_AMBULATORY_CARE_PROVIDER_SITE_OTHER): Payer: Self-pay | Admitting: *Deleted

## 2010-04-12 DIAGNOSIS — K5150 Left sided colitis without complications: Secondary | ICD-10-CM

## 2010-04-12 LAB — HEPATIC FUNCTION PANEL
ALT: 25 U/L (ref 0–53)
AST: 26 U/L (ref 0–37)
Albumin: 4.5 g/dL (ref 3.5–5.2)
Alkaline Phosphatase: 58 U/L (ref 39–117)
Bilirubin, Direct: 0.1 mg/dL (ref 0.0–0.3)
Total Bilirubin: 0.7 mg/dL (ref 0.3–1.2)
Total Protein: 6.6 g/dL (ref 6.0–8.3)

## 2010-04-12 LAB — CBC WITH DIFFERENTIAL/PLATELET
Basophils Absolute: 0.1 10*3/uL (ref 0.0–0.1)
Basophils Relative: 1.2 % (ref 0.0–3.0)
Eosinophils Absolute: 0.4 10*3/uL (ref 0.0–0.7)
Eosinophils Relative: 8.6 % — ABNORMAL HIGH (ref 0.0–5.0)
HCT: 43 % (ref 39.0–52.0)
Hemoglobin: 15.1 g/dL (ref 13.0–17.0)
Lymphocytes Relative: 45 % (ref 12.0–46.0)
Lymphs Abs: 1.9 10*3/uL (ref 0.7–4.0)
MCHC: 35.1 g/dL (ref 30.0–36.0)
MCV: 97.2 fl (ref 78.0–100.0)
Monocytes Absolute: 0.4 10*3/uL (ref 0.1–1.0)
Monocytes Relative: 10.1 % (ref 3.0–12.0)
Neutro Abs: 1.5 10*3/uL (ref 1.4–7.7)
Neutrophils Relative %: 35.1 % — ABNORMAL LOW (ref 43.0–77.0)
Platelets: 159 10*3/uL (ref 150.0–400.0)
RBC: 4.43 Mil/uL (ref 4.22–5.81)
RDW: 13.8 % (ref 11.5–14.6)
WBC: 4.2 10*3/uL — ABNORMAL LOW (ref 4.5–10.5)

## 2010-04-13 NOTE — Letter (Signed)
Summary: Office Visit Letter  [facility]  [address]   [place], [place] [address]   Phone: [phone]  Fax: [phone]      April 07, 2010 MRN: [id number]   Bobby Medina [address] [place], [place]  [address]   Dear Mr. WISHON,   According to our records, it is time for you to schedule a follow-up office visit with [place].   At your convenience, please call [phone] (option #2)to schedule an office visit. If you have any questions, concerns, or feel that this letter is in error, we would appreciate your call.   Sincerely,   Sheryn Bison, M.D.   [facility] [phone]

## 2010-04-21 ENCOUNTER — Telehealth: Payer: Self-pay | Admitting: Gastroenterology

## 2010-04-21 NOTE — Telephone Encounter (Signed)
Notified pt that it's okay to take the 2 meds with his Colitis. Pt stated understanding.

## 2010-05-04 ENCOUNTER — Encounter: Payer: Self-pay | Admitting: Gastroenterology

## 2010-05-04 ENCOUNTER — Ambulatory Visit (INDEPENDENT_AMBULATORY_CARE_PROVIDER_SITE_OTHER): Payer: BC Managed Care – PPO | Admitting: Gastroenterology

## 2010-05-04 VITALS — BP 122/78 | HR 60 | Ht 75.0 in | Wt 200.0 lb

## 2010-05-04 DIAGNOSIS — B009 Herpesviral infection, unspecified: Secondary | ICD-10-CM

## 2010-05-04 DIAGNOSIS — K5150 Left sided colitis without complications: Secondary | ICD-10-CM

## 2010-05-04 MED ORDER — ACYCLOVIR 5 % EX OINT: TOPICAL_OINTMENT | CUTANEOUS | Status: DC

## 2010-05-04 NOTE — Patient Instructions (Signed)
Your prescription(s) have been sent to you pharmacy.  Make a 3 month follow up and have labs prior to the office visit.

## 2010-05-04 NOTE — Progress Notes (Signed)
This is a 35 year old Caucasian male with refractory all strep colitis who currently is doing well all 6-MP 50 mg a day and Remicade 100 mg infusions every 8 weeks. He denies any gastrointestinal or general medical problems or recurrent infections. He does have a mouth sores lip which has been a recurrent problem for several years. His appetite is good and his weight is stable and a is able to do all physical maneuvers that he needs to do. He specifically denies arthralgias, skin rashes, myalgias, fever chills.   Pertinent Review of Systems Negative   Physical Exam: Awake and alert no acute distress. No stigmata of chronic liver disease noted. His chest is clear to percussion and auscultation, cardiac exam shows no murmurs gallops or rubs. Abdominal exam shows no organomegaly, masses or tenderness. Peripheral extremities are unremarkable mental status is normal. Rectal exam is deferred at this time. Healing area of numbness and tenderness on the left lower lip area. There is no ulceration present or lymphadenopathy.    Assessment and Plan: Ulcerative colitis in remission on Remicade infusions and 6-MP 50 mg a day. His 6-MP dose was recently decreased because of some abnormal liver function tests which have resolved. We will continue 6-MP for another 3 months and probably discontinue this medication at that time if he is in remission on his regular Remicade infusions. Before his next clinic visit we will repeat his CBC and liver profile. Did give him some Valtrex to use on his left lower lip recurrent herpes simplex lesion which appears to be healing.

## 2010-05-11 ENCOUNTER — Other Ambulatory Visit: Payer: BC Managed Care – PPO

## 2010-06-15 NOTE — Assessment & Plan Note (Signed)
[facility]                                 ON-CALL NOTE   JODY, SILAS                         MRN:          [id number]  DATE:04/08/2009                            DOB:          1975/02/12    TELEPHONE NOTE   Mr. Bhardwaj wife called to say that he has flu-like symptoms including  achiness, a little bit of fever.  She inquired whether it is safe for  him to take Tylenol.  I said that would be perfectly acceptable.  In  addition, she inquired about TheraFlu and I indicated that would be  extremely acceptable to do as well.  I advised her to contact her  primary care physician.     Barbette Hair. Arlyce Dice, MD,FACG     RDK/MedQ  DD: 04/08/2009  DT: 04/09/2009  Job #: [id number]   cc:   Vania Rea. Jarold Motto, MD, Caleen Essex, FAGA

## 2010-07-26 ENCOUNTER — Telehealth: Payer: Self-pay | Admitting: *Deleted

## 2010-07-26 NOTE — Telephone Encounter (Signed)
Pt ov made for 08/09/2010 and labs will be done this week.

## 2010-07-26 NOTE — Telephone Encounter (Signed)
Message copied by Leonette Monarch on Mon Jul 26, 2010  8:21 AM ------      Message from: Harlow Mares D      Created: Tue May 04, 2010 10:06 AM       Call pt to remind him to get labs and ov

## 2010-07-27 ENCOUNTER — Other Ambulatory Visit: Payer: Self-pay | Admitting: Gastroenterology

## 2010-07-27 ENCOUNTER — Telehealth: Payer: Self-pay | Admitting: *Deleted

## 2010-07-27 ENCOUNTER — Other Ambulatory Visit (INDEPENDENT_AMBULATORY_CARE_PROVIDER_SITE_OTHER): Payer: BC Managed Care – PPO

## 2010-07-27 DIAGNOSIS — K5150 Left sided colitis without complications: Secondary | ICD-10-CM

## 2010-07-27 LAB — HEPATIC FUNCTION PANEL
Albumin: 4.9 g/dL (ref 3.5–5.2)
Alkaline Phosphatase: 52 U/L (ref 39–117)
Total Protein: 7.2 g/dL (ref 6.0–8.3)

## 2010-07-27 LAB — CBC WITH DIFFERENTIAL/PLATELET
Basophils Relative: 1 % (ref 0.0–3.0)
Eosinophils Absolute: 0.3 10*3/uL (ref 0.0–0.7)
HCT: 45.6 % (ref 39.0–52.0)
Hemoglobin: 15.8 g/dL (ref 13.0–17.0)
Lymphocytes Relative: 27.6 % (ref 12.0–46.0)
Lymphs Abs: 1.6 10*3/uL (ref 0.7–4.0)
MCHC: 34.5 g/dL (ref 30.0–36.0)
MCV: 98.9 fl (ref 78.0–100.0)
Monocytes Absolute: 0.4 10*3/uL (ref 0.1–1.0)
Neutro Abs: 3.5 10*3/uL (ref 1.4–7.7)
RBC: 4.61 Mil/uL (ref 4.22–5.81)

## 2010-07-27 NOTE — Telephone Encounter (Signed)
Message copied by Leonette Monarch on Tue Jul 27, 2010  2:35 PM ------      Message from: Mardella Layman      Created: Tue Jul 27, 2010  1:52 PM       Stop 6mp and continue remicade,,,

## 2010-07-28 NOTE — Telephone Encounter (Signed)
Advised pt he can stop 6mp and labs look good but he is going on vacation and would like to stay on it until he is back home he has an office visit on 08/09/2010 he will stop it after coming for the visit.

## 2010-08-09 ENCOUNTER — Ambulatory Visit (INDEPENDENT_AMBULATORY_CARE_PROVIDER_SITE_OTHER): Payer: BC Managed Care – PPO | Admitting: Gastroenterology

## 2010-08-09 ENCOUNTER — Encounter: Payer: Self-pay | Admitting: Gastroenterology

## 2010-08-09 VITALS — BP 120/64 | HR 88 | Ht 75.0 in | Wt 201.0 lb

## 2010-08-09 DIAGNOSIS — K639 Disease of intestine, unspecified: Secondary | ICD-10-CM

## 2010-08-09 DIAGNOSIS — K5120 Ulcerative (chronic) proctitis without complications: Secondary | ICD-10-CM

## 2010-08-09 NOTE — Progress Notes (Signed)
History of Present Illness: This is a 35 year old Caucasian male with a history of severe ulcerative colitis refractory to prednisone therapy and immunosuppressive therapy. He has had an excellent response to Remicade infusions 5 mg per kilogram every 8 weeks. He currently denies any gastrointestinal or general medical problems. Lab data has all been reviewed and is essentially normal. He has had a chronic shoulder injury and is scheduled to have orthopedic surgery in mid cycle of his Remicade infusions.    Current Medications, Allergies, Past Medical History, Past Surgical History, Family History and Social History were reviewed in [employer] record.   Assessment and plan:Pan ulcerative colitis in remission on biological therapy. I have advised him to continue his infusions but to stop 6-MP. Also, he should not be on anti-inflammatories, and should notify us immediately of any status change in his symptoms. Otherwise I will see him in 6 months.  Please copy her primary care physician, referring physician, and pertinent subspecialists. No diagnosis found.

## 2010-08-09 NOTE — Patient Instructions (Signed)
Stop . Make office visit for 6 months.

## 2010-08-11 ENCOUNTER — Telehealth: Payer: Self-pay | Admitting: Gastroenterology

## 2010-08-11 NOTE — Telephone Encounter (Signed)
Notes sent to ortho.

## 2011-02-07 ENCOUNTER — Telehealth: Payer: Self-pay | Admitting: *Deleted

## 2011-02-07 DIAGNOSIS — K5190 Ulcerative colitis, unspecified, without complications: Secondary | ICD-10-CM

## 2011-02-07 NOTE — Telephone Encounter (Signed)
Message copied by Leonette Monarch on Mon Feb 07, 2011 12:34 PM ------      Message from: Jarold Motto, DAVID R      Created: Mon Feb 07, 2011 10:42 AM       CBC,CRP LFT'S      ----- Message -----         From: Harlow Mares, CMA         Sent: 02/07/2011   8:49 AM           To: Sheryn Bison, MD            Patient needs office i am going to make him one will you let me know if he needs labs before he comes.            thanks

## 2011-02-07 NOTE — Telephone Encounter (Signed)
appt made for 02/10/2011 and labs will be done today or tomorrow

## 2011-02-09 ENCOUNTER — Other Ambulatory Visit (INDEPENDENT_AMBULATORY_CARE_PROVIDER_SITE_OTHER): Payer: BC Managed Care – PPO

## 2011-02-09 DIAGNOSIS — K5190 Ulcerative colitis, unspecified, without complications: Secondary | ICD-10-CM

## 2011-02-09 LAB — CBC WITH DIFFERENTIAL/PLATELET
Basophils Absolute: 0.1 10*3/uL (ref 0.0–0.1)
Eosinophils Relative: 7 % — ABNORMAL HIGH (ref 0.0–5.0)
HCT: 46.3 % (ref 39.0–52.0)
Lymphs Abs: 2.2 10*3/uL (ref 0.7–4.0)
Monocytes Absolute: 0.4 10*3/uL (ref 0.1–1.0)
Monocytes Relative: 7.3 % (ref 3.0–12.0)
Neutrophils Relative %: 47.4 % (ref 43.0–77.0)
Platelets: 181 10*3/uL (ref 150.0–400.0)
RDW: 12.8 % (ref 11.5–14.6)
WBC: 5.9 10*3/uL (ref 4.5–10.5)

## 2011-02-09 LAB — HEPATIC FUNCTION PANEL
ALT: 38 U/L (ref 0–53)
AST: 23 U/L (ref 0–37)
Albumin: 4.6 g/dL (ref 3.5–5.2)
Alkaline Phosphatase: 54 U/L (ref 39–117)

## 2011-02-09 LAB — HIGH SENSITIVITY CRP: CRP, High Sensitivity: 0.38 mg/L (ref 0.000–5.000)

## 2011-02-10 ENCOUNTER — Ambulatory Visit (INDEPENDENT_AMBULATORY_CARE_PROVIDER_SITE_OTHER): Payer: BC Managed Care – PPO | Admitting: Gastroenterology

## 2011-02-10 ENCOUNTER — Encounter: Payer: Self-pay | Admitting: Gastroenterology

## 2011-02-10 VITALS — BP 124/72 | HR 76 | Ht 75.0 in | Wt 208.6 lb

## 2011-02-10 DIAGNOSIS — K5190 Ulcerative colitis, unspecified, without complications: Secondary | ICD-10-CM

## 2011-02-10 NOTE — Progress Notes (Signed)
This is a 36 year old Caucasian male with chronic left-sided ulcerative colitis currently in remission on Remicade infusions every 8 weeks. He denies abdominal pain, diarrhea, or rectal bleeding. His appetite is good, his weight is stable, and he denies any systemic complaints such as fever or chills. He did receive a flu shot this year. He received his Remicade infusions at [facility] per Dr. Dareen Piano.  Current Medications, Allergies, Past Medical History, Past Surgical History, Family History and Social History were reviewed in [employer] record.  Pertinent Review of Systems Negative   Physical Exam: Healthy appearing patient in no distress. I cannot appreciate stigmata of chronic liver disease. Chest is clear, and cardiac exam shows no murmurs gallops or rubs. His abdomen shows no organomegaly, masses, tenderness, or distention. Peripheral extremities are unremarkable mental status is normal.    Assessment and Plan: Left-sided ulcerative colitis in remission on biological therapy. We will continue his infusions at his current dosage and timing with GI followup every 6 months or when necessary as needed. Recent labs were reviewed and show a normal CBC and a CRP of less than 1. No diagnosis found.

## 2011-02-10 NOTE — Patient Instructions (Signed)
Office visit for 6 months.

## 2011-02-15 ENCOUNTER — Ambulatory Visit: Payer: BC Managed Care – PPO | Admitting: Gastroenterology

## 2011-06-06 ENCOUNTER — Other Ambulatory Visit: Payer: Self-pay | Admitting: Gastroenterology

## 2011-06-06 DIAGNOSIS — K5190 Ulcerative colitis, unspecified, without complications: Secondary | ICD-10-CM

## 2011-06-07 ENCOUNTER — Ambulatory Visit (INDEPENDENT_AMBULATORY_CARE_PROVIDER_SITE_OTHER): Payer: BC Managed Care – PPO | Admitting: Gastroenterology

## 2011-06-07 DIAGNOSIS — K5150 Left sided colitis without complications: Secondary | ICD-10-CM

## 2011-06-07 DIAGNOSIS — K5190 Ulcerative colitis, unspecified, without complications: Secondary | ICD-10-CM

## 2011-06-09 LAB — TB SKIN TEST: TB Skin Test: NEGATIVE mm

## 2011-07-04 ENCOUNTER — Telehealth: Payer: Self-pay | Admitting: Gastroenterology

## 2011-07-04 DIAGNOSIS — K5190 Ulcerative colitis, unspecified, without complications: Secondary | ICD-10-CM

## 2011-07-04 NOTE — Telephone Encounter (Signed)
Explained to pt he doesn't need labs until July. Pt stated understanding, but stated Dr Ewell Poe ofc, [facility], called and requested the lab.

## 2011-07-05 ENCOUNTER — Other Ambulatory Visit (INDEPENDENT_AMBULATORY_CARE_PROVIDER_SITE_OTHER): Payer: BC Managed Care – PPO

## 2011-07-05 ENCOUNTER — Other Ambulatory Visit: Payer: Self-pay | Admitting: Gastroenterology

## 2011-07-05 DIAGNOSIS — K5190 Ulcerative colitis, unspecified, without complications: Secondary | ICD-10-CM

## 2011-07-05 LAB — CBC WITH DIFFERENTIAL/PLATELET
Eosinophils Absolute: 0.6 10*3/uL (ref 0.0–0.7)
Lymphocytes Relative: 37 % (ref 12.0–46.0)
MCHC: 34.4 g/dL (ref 30.0–36.0)
MCV: 94.3 fl (ref 78.0–100.0)
Monocytes Absolute: 0.4 10*3/uL (ref 0.1–1.0)
Neutrophils Relative %: 45.3 % (ref 43.0–77.0)
Platelets: 152 10*3/uL (ref 150.0–400.0)
WBC: 6.3 10*3/uL (ref 4.5–10.5)

## 2011-07-05 LAB — HEPATIC FUNCTION PANEL
Alkaline Phosphatase: 55 U/L (ref 39–117)
Bilirubin, Direct: 0.1 mg/dL (ref 0.0–0.3)
Total Bilirubin: 0.5 mg/dL (ref 0.3–1.2)

## 2011-08-17 ENCOUNTER — Telehealth: Payer: Self-pay | Admitting: Gastroenterology

## 2011-08-17 NOTE — Telephone Encounter (Signed)
Needs 6 mos f/u

## 2011-08-17 NOTE — Telephone Encounter (Signed)
Pt will come in tomorrow at 0830am.

## 2011-08-17 NOTE — Telephone Encounter (Signed)
Pt reports he's been having slight nausea on and off after his Remicade treatments and this usually lasts for several days. Also, he repots increased fatigue before his tx on Monday 08/15/11. [facility] does his infusions and stated he was ok for tx. He states he is still fatigued and gets very tired when he stands or sits for a long; states maybe he had a bug or something. He does report the nausea got better when he took Zantac. Pt has been on Remicade for almost 2 years. Please advise; zofran prn? Thanks.

## 2011-08-18 ENCOUNTER — Encounter: Payer: Self-pay | Admitting: Gastroenterology

## 2011-08-18 ENCOUNTER — Ambulatory Visit (INDEPENDENT_AMBULATORY_CARE_PROVIDER_SITE_OTHER): Payer: BC Managed Care – PPO | Admitting: Gastroenterology

## 2011-08-18 VITALS — BP 122/70 | HR 64 | Ht 74.0 in | Wt 200.0 lb

## 2011-08-18 DIAGNOSIS — R0602 Shortness of breath: Secondary | ICD-10-CM

## 2011-08-18 DIAGNOSIS — R5383 Other fatigue: Secondary | ICD-10-CM

## 2011-08-18 DIAGNOSIS — Z79899 Other long term (current) drug therapy: Secondary | ICD-10-CM

## 2011-08-18 DIAGNOSIS — K219 Gastro-esophageal reflux disease without esophagitis: Secondary | ICD-10-CM

## 2011-08-18 DIAGNOSIS — R5381 Other malaise: Secondary | ICD-10-CM

## 2011-08-18 DIAGNOSIS — K5190 Ulcerative colitis, unspecified, without complications: Secondary | ICD-10-CM

## 2011-08-18 MED ORDER — RANITIDINE HCL 300 MG PO TABS: 300.0000 mg | ORAL_TABLET | Freq: Every day | ORAL | Status: DC

## 2011-08-18 NOTE — Patient Instructions (Addendum)
We have sent the following medications to your pharmacy for you to pick up at your convenience: Zantac 300 mg-Take 1 tablet at bedtime. Dr Jarold Motto has personally spoken to Dr Azzie Roup. Dr Dareen Piano should be calling you with an appointment time and date to see him. If you have not heard back from him within 1 week, please call our office back at [phone]. Please follow up with Dr Jarold Motto in 3 months. CC: Dr Azzie Roup

## 2011-08-18 NOTE — Progress Notes (Signed)
This is a 36 year old Caucasian male with ulcerative colitis in remission on Remicade 5 mg per kilogram infusions every 8 weeks. His colitis is in remission, but he now complains of severe fatigue and some shortness of breath with exertion over the last 4-5 days. His most recent Remicade infusion was 3 days ago. He has no gastrointestinal complaints, or any specific cardiopulmonary issues. He is concerned about his father who recently had a heart attack at age 46 without prior symptoms. Otherwise, he denies stress in his life or any marital or psychological problems. His appetite is good and his weight is stable. There is no associated skin rashes, joint pains, oral stomatitis, and he is on Zantac for GERD. Other complaints are mild nausea without any specific hepatobiliary symptomatology. He does not abuse alcohol, cigarettes, or NSAIDs.  Current Medications, Allergies, Past Medical History, Past Surgical History, Family History and Social History were reviewed in [employer] record.  Pertinent Review of Systems Negative   Physical Exam: Blood pressure 122/70, pulse 64 and regular, weight 200 pounds with a BMI of 25.68. I cannot appreciate stigmata of chronic liver disease, thyromegaly or lymphadenopathy. Chest is entirely clear to percussion of dictation, and he is in a regular rhythm without murmurs gallops or rubs. There is no evidence of hepatosplenomegaly, abdominal masses or tenderness. Bowel sounds are normal. Peripheral extremities are unremarkable without edema, phlebitis, or swollen joints. He does have some superficial varicosities on his right anterior leg area. Mental status entirely normal without focal neurological deficits.    Assessment and Plan: Ulcerative colitis in remission on every 8 week Remicade infusions. His fatigue is possibly related to Remicade, but I certainly see no evidence of cardiac compromise or pulmonary events.. I have spoken with Dr. Dareen Piano  in rheumatology, and he will call Minerva Areola for evaluation and advice. I have asked Anthoni to take Zantac 300 mg at bedtime for his GERD, and for now to continue on his Remicade infusions as planned. He has no other symptoms to suggest MAST cell activation syndrome. We may need to consider 2-D echocardiogram evaluation if his problems worsen. Recent labs were reviewed in all have been unremarkable without evidence of anemia or elevated liver function tests. He has no clinical exam evidence of mono, but this certainly is a possibility for his age and biological infusions. Her will wait to hear from Dr. Dareen Piano, also from the patient as to his clinical course. Otherwise he will continue see me every 3 months as previously planned. No diagnosis found.

## 2012-07-30 ENCOUNTER — Telehealth: Payer: Self-pay | Admitting: *Deleted

## 2012-07-30 NOTE — Telephone Encounter (Signed)
Called patient to make a follow up office visit per Dr. Jarold Motto. Had to leave message for call back.

## 2012-08-07 ENCOUNTER — Ambulatory Visit (INDEPENDENT_AMBULATORY_CARE_PROVIDER_SITE_OTHER): Payer: BC Managed Care – PPO | Admitting: Gastroenterology

## 2012-08-07 ENCOUNTER — Encounter: Payer: Self-pay | Admitting: Gastroenterology

## 2012-08-07 VITALS — BP 106/64 | HR 71 | Ht 74.0 in | Wt 207.5 lb

## 2012-08-07 DIAGNOSIS — Z79899 Other long term (current) drug therapy: Secondary | ICD-10-CM

## 2012-08-07 DIAGNOSIS — K5120 Ulcerative (chronic) proctitis without complications: Secondary | ICD-10-CM

## 2012-08-07 NOTE — Progress Notes (Signed)
This is a 37 year old Caucasian male with idiopathic ulcerative colitis currently in remission on Remicade infusions 5 mg per kilogram every 8 weeks per the infusion center and Dr. Dareen Piano.  He currently denies any GI complaints or general medical problems.  He has had some crampy abdominal pain with ingestion of bananas, but denies other food intolerances, rectal bleeding, diarrhea or abdominal pain.  He's had no increased infections, and denies any cardiopulmonary or general medical symptomatology such as fever, chills, skin rashes, joint pains, oral stomatitis.  His appetite is good his weight is stable.  Apparently has blood work done every 6 months, but I do not have access to these results currently but will request his data from Dr. Dareen Piano.  Current Medications, Allergies, Past Medical History, Past Surgical History, Family History and Social History were reviewed in [employer] record.  ROS: All systems were reviewed and are negative unless otherwise stated in the HPI.          Physical Exam: Healthy patient in no distress.  Blood pressure 106/64, pulse 71 and regular and weight 207 with a BMI of 26.63.  Cannot appreciate stigmata of chronic liver disease.  Chest is entirely clear he is in a regular rhythm without murmurs gallops or rubs.  There is no hepatosplenomegaly, abdominal masses or tenderness.  Bowel sounds are normal.  Peripheral extremities are unremarkable.  Mental status is normal.    Assessment and Plan: Ulcerative colitis in remission on Remicade infusions every 8 weeks as per standard protocol.  I will have Amy Esterwood.PA review his immunization record, will be sure that he is up-to-date on his immunizations and other requirements per the [facility] concerning biological medication long-term management.  I have asked him to see me every 6 months or when necessary as needed.  Again, labs requested for review. No diagnosis found.

## 2012-08-07 NOTE — Patient Instructions (Signed)
  We will call Dr. Dareen Piano to obtain your lab results.  Please follow up with Dr. Jarold Motto in six months. ___________________________________________________________________                                               We are excited to introduce MyChart, a new best-in-class service that provides you online access to important information in your electronic medical record. We want to make it easier for you to view your health information - all in one secure location - when and where you need it. We expect MyChart will enhance the quality of care and service we provide.  When you register for MyChart, you can:    View your test results.    Request appointments and receive appointment reminders via email.    Request medication renewals.    View your medical history, allergies, medications and immunizations.    Communicate with your physician's office through a password-protected site.    Conveniently print information such as your medication lists.  To find out if MyChart is right for you, please talk to a member of our clinical staff today. We will gladly answer your questions about this free health and wellness tool.  If you are age 16 or older and want a member of your family to have access to your record, you must provide written consent by completing a proxy form available at our office. Please speak to our clinical staff about guidelines regarding accounts for patients younger than age 44.  As you activate your MyChart account and need any technical assistance, please call the MyChart technical support line at [phone]-[phone]) or email your question to [email or web address]. If you email your question(s), please include your name, a return phone number and the best time to reach you.  If you have non-urgent health-related questions, you can send a message to our office through MyChart at [email or web address]. If you have a medical emergency, call 911.  Thank  you for using MyChart as your new health and wellness resource!   MyChart licensed from [employer],  [id number]. Patents Pending.

## 2012-08-13 ENCOUNTER — Encounter: Payer: Self-pay | Admitting: Rheumatology

## 2012-08-16 ENCOUNTER — Telehealth: Payer: Self-pay | Admitting: *Deleted

## 2012-08-16 ENCOUNTER — Other Ambulatory Visit: Payer: Self-pay | Admitting: *Deleted

## 2012-08-16 DIAGNOSIS — K5190 Ulcerative colitis, unspecified, without complications: Secondary | ICD-10-CM

## 2012-08-16 NOTE — Telephone Encounter (Signed)
Message copied by Florene Glen on Thu Aug 16, 2012  1:12 PM ------      Message from: Mike Gip S      Created: Tue Aug 07, 2012 12:40 PM       Aram Beecham-  Dr. Jarold Motto is having me review some of his IBD patients immunizations etc-to be sure they have what they need.. Please order Hep B SAG, hep B SAB, and Hep B core  AB , and vit D level on this pt. Also set him up for Pneumovax, and remind him he need flu shot in the fall. Thanks:) ------

## 2012-08-16 NOTE — Telephone Encounter (Signed)
I called patient's primary care doctor's office I called patient back and left a message for patient to call office back His primary care doctor does not do third party labs so patient will have to do labs here  Called patient and left message for call back

## 2012-08-16 NOTE — Telephone Encounter (Signed)
There is more documentation in the chart. I put orders in and I left patient a message to call office back because his labs cannot be done at his primary care.  Waiting on patient to call back.

## 2012-08-16 NOTE — Telephone Encounter (Signed)
Tresa Endo, can you please handle this for me? Thanks.

## 2012-08-16 NOTE — Telephone Encounter (Signed)
I called patient and patient wants his labs done at [facility] and Pneumovax.  Faxed over orders Patient notified

## 2012-08-16 NOTE — Telephone Encounter (Signed)
Patient called back. I told patient that he will have to get labs done here because his primary care will not do outside labs(third party). Patient said he will come here for the labs. I advised patient that the lab is open 7:30 am to 5:30 pm Monday thru Friday. I advised patient that he needs to get Pneumovax as well  Patient said that he wants to get Pneumovax done at his primary care doctor I advised patient that we will need a copy that the Pneumovax was done Patient verbalized understanding

## 2012-12-20 ENCOUNTER — Encounter: Payer: Self-pay | Admitting: Gastroenterology

## 2013-06-14 ENCOUNTER — Encounter: Payer: Self-pay | Admitting: Internal Medicine

## 2013-06-14 ENCOUNTER — Ambulatory Visit (INDEPENDENT_AMBULATORY_CARE_PROVIDER_SITE_OTHER): Payer: BC Managed Care – PPO | Admitting: Internal Medicine

## 2013-06-14 VITALS — BP 112/70 | HR 66 | Ht 74.0 in | Wt 208.0 lb

## 2013-06-14 DIAGNOSIS — Z79899 Other long term (current) drug therapy: Secondary | ICD-10-CM

## 2013-06-14 DIAGNOSIS — K219 Gastro-esophageal reflux disease without esophagitis: Secondary | ICD-10-CM

## 2013-06-14 DIAGNOSIS — K5150 Left sided colitis without complications: Secondary | ICD-10-CM

## 2013-06-14 DIAGNOSIS — Z79620 Long term (current) use of immunosuppressive biologic: Secondary | ICD-10-CM

## 2013-06-14 NOTE — Progress Notes (Signed)
HISTORY OF PRESENT ILLNESS:  Bobby Medina is a 38 y.o. male diagnosed with left-sided ulcerative colitis about 8 years ago. He was followed by Dr. Sheryn Bisonavid Patterson until Dr. Norval GablePatterson's retirement. Patient was refractory to multiple medical therapies including oral/topical mesalamine and prednisone (steroid-dependent). For about 4 years he has been on Remicade 5 mg per kilogram every 8 weeks. He states that he has been doing perfect. No issues since his last office visit July 2014. He reports one formed bowel movement per day. No blood or mucus. Occasional abdominal cramping in the morning after breakfast, but otherwise no issues. He receives his Rem[facility] infusions [facility] under the supervision of Dr. Dareen PianoAnderson. Regular blood work there has been reported as unremarkable.  REVIEW OF SYSTEMS:  All non-GI ROS negative upon extensive review. No evidence for extraintestinal manifestations of IBD  Past Medical History  Diagnosis Date  . Hemorrhage of rectum and anus   . Hiatal hernia   . Hemorrhoids   . Left sided ulcerative (chronic) colitis     Past Surgical History  Procedure Laterality Date  . Knee arthroscopy    . Shoulder surgery      right    Social History Bobby Belfastric W Shingler  reports that he has never smoked. He has never used smokeless tobacco. He reports that he does not drink alcohol or use illicit drugs.  family history includes Heart disease in his maternal grandfather.  No Known Allergies     PHYSICAL EXAMINATION: Vital signs: BP 112/70  Pulse 66  Ht 6\' 2"  (1.88 m)  Wt 208 lb (94.348 kg)  BMI 26.69 kg/m2 General: Well-developed, well-nourished, no acute distress HEENT: Sclerae are anicteric, conjunctiva pink. Oral mucosa intact Lungs: Clear Heart: Regular Abdomen: soft, nontender, nondistended, no obvious ascites, no peritoneal signs, normal bowel sounds. No organomegaly. Extremities: No edema Psychiatric: alert and oriented x3.  Cooperative   ASSESSMENT:  #1. Left-sided ulcerative colitis. In remission on Remicade #2. Last colon evaluation February 2011   PLAN:  #1. Continue Remicade infusion therapy #2. Office followup in 1 year. Would perform surveillance colonoscopy at that time. Contact the office in the interim for any questions or problems

## 2013-06-14 NOTE — Patient Instructions (Signed)
Continue your Remicade and follow up with Dr. Marina GoodellPerry as needed

## 2013-09-04 ENCOUNTER — Encounter: Payer: Self-pay | Admitting: Gastroenterology

## 2014-11-25 ENCOUNTER — Encounter: Payer: Self-pay | Admitting: Internal Medicine

## 2014-11-25 ENCOUNTER — Ambulatory Visit (INDEPENDENT_AMBULATORY_CARE_PROVIDER_SITE_OTHER): Payer: BC Managed Care – PPO | Admitting: Internal Medicine

## 2014-11-25 VITALS — BP 110/60 | HR 62 | Ht 74.0 in | Wt 204.2 lb

## 2014-11-25 DIAGNOSIS — Z79620 Long term (current) use of immunosuppressive biologic: Secondary | ICD-10-CM

## 2014-11-25 DIAGNOSIS — K219 Gastro-esophageal reflux disease without esophagitis: Secondary | ICD-10-CM

## 2014-11-25 DIAGNOSIS — K5150 Left sided colitis without complications: Secondary | ICD-10-CM | POA: Diagnosis not present

## 2014-11-25 DIAGNOSIS — Z79899 Other long term (current) drug therapy: Secondary | ICD-10-CM | POA: Diagnosis not present

## 2014-11-25 NOTE — Progress Notes (Signed)
HISTORY OF PRESENT ILLNESS:  Bobby Medina is a 39 y.o. male diagnosed with left-sided ulcerative colitis almost 10 years ago. He was followed by Sheryn Bisonavid Patterson, M.D. previously. The patient established with me May 2015. His colitis had been refractory to multiple medical therapies including oral and topical mesalamine as well as prednisone (became steroid dependent). He has been on Remicade 5 mg/kg every 8 weeks for approximately 5 years. He continues to do well. His GI review of systems is entirely negative. He receives his Rem[facility]ade infusions at [facility]. They do obtain blood work every 16 weeks. He is to get me copies. As well, he has annual TB testing which has been negative. His last complete colonoscopy was performed October 2009. His last flexible sigmoidoscopy February 2011 with active colitis. He is due for surveillance colonoscopy. His only other medication is vitamin D. He denies extraintestinal manifestations of inflammatory bowel disease on questioning  REVIEW OF SYSTEMS:  All non-GI ROS negative entirely upon complete comprehensive review  Past Medical History  Diagnosis Date  . Hemorrhage of rectum and anus   . Hiatal hernia   . Hemorrhoids   . Left sided ulcerative (chronic) colitis (HCC)   . GERD (gastroesophageal reflux disease)     Past Surgical History  Procedure Laterality Date  . Knee arthroscopy    . Shoulder surgery      right    Social History Bobby Medina  reports that he has never smoked. He has never used smokeless tobacco. He reports that he does not drink alcohol or use illicit drugs.  family history includes Heart disease in his maternal grandfather.  No Known Allergies     PHYSICAL EXAMINATION: Vital signs: BP 110/60 mmHg  Pulse 62  Ht 6\' 2"  (1.88 m)  Wt 204 lb 4 oz (92.647 kg)  BMI 26.21 kg/m2  Constitutional: generally well-appearing, no acute distress Psychiatric: alert and oriented x3, cooperative Eyes: extraocular  movements intact, anicteric, conjunctiva pink Mouth: oral pharynx moist, no lesions except for isolated fever blister on  lip Neck: supple no lymphadenopathy Cardiovascular: heart regular rate and rhythm, no murmur Lungs: clear to auscultation bilaterally Abdomen: soft, nontender, nondistended, no obvious ascites, no peritoneal signs, normal bowel sounds, no organomegaly Rectal: deferred until colonoscopy Extremities: no clubbing cyanosis or lower extremity edema bilaterally Skin: no lesions on visible extremities Neuro: No focal deficits.   ASSESSMENT:  #1. Long-standing left-sided ulcerative colitis. Remains in remission (clinically) on Remicade #2. Last complete colonoscopy 2009, last flexible sigmoidoscopy February 2011. Due for follow-up surveillance colonoscopy  PLAN:  #1. Continue Remicade #2. The patient has agreed to have outside blood work fax to my nurse for my review #3. Schedule colonoscopy with biopsies.The nature of the procedure, as well as the risks, benefits, and alternatives were carefully and thoroughly reviewed with the patient. Ample time for discussion and questions allowed. The patient understood, was satisfied, and agreed to proceed. The patient will contact the office, likely after the new year to arrange.  25 minutes was spent with the patient. Greater than 50% the time she is for counseling. We discussed most current management strategies for chronic inflammatory bowel disease as well as long term and ongoing use of current therapy.

## 2014-11-25 NOTE — Patient Instructions (Signed)
Continue on your Remicade as discussed with Dr. Marina GoodellPerry.    Call Bonita QuinLinda to schedule the appropriate labs  Please call after the first of the year to schedule a colonoscopy

## 2015-07-06 ENCOUNTER — Telehealth: Payer: Self-pay | Admitting: Internal Medicine

## 2015-07-06 NOTE — Telephone Encounter (Signed)
Pt states he has been diagnosed with shingles. His PCP told him to hold the Remicade until it has resolved. Pt calling to let Dr. Marina GoodellPerry know. Pt also wants to know if it is ok to push his colonoscopy out until next year when he can sign up for the wellness benefits at work. Please advise.

## 2015-07-07 NOTE — Telephone Encounter (Signed)
Pt aware.

## 2015-07-07 NOTE — Telephone Encounter (Signed)
Yes, hold Remicade until shingles have resolved) then promptly resume. Okay to postpone colonoscopy until next year when his work benefits issues are sorted out. He should see me for routine office visit, however, in the fall. Thanks

## 2015-08-31 ENCOUNTER — Other Ambulatory Visit: Payer: Self-pay | Admitting: Internal Medicine

## 2015-10-12 ENCOUNTER — Ambulatory Visit (INDEPENDENT_AMBULATORY_CARE_PROVIDER_SITE_OTHER): Payer: BC Managed Care – PPO | Admitting: Internal Medicine

## 2015-10-12 ENCOUNTER — Encounter (INDEPENDENT_AMBULATORY_CARE_PROVIDER_SITE_OTHER): Payer: Self-pay

## 2015-10-12 ENCOUNTER — Encounter: Payer: Self-pay | Admitting: Internal Medicine

## 2015-10-12 VITALS — BP 122/68 | HR 68 | Ht 74.0 in | Wt 208.0 lb

## 2015-10-12 DIAGNOSIS — K5100 Ulcerative (chronic) pancolitis without complications: Secondary | ICD-10-CM

## 2015-10-12 NOTE — Progress Notes (Signed)
HISTORY OF PRESENT ILLNESS:  Bobby Medina is a 40 y.o. male diagnosed with left-sided ulcerative colitis 10 years ago. Previous patient of Dr. Sheryn Bisonavid Patterson. Established with me May 2015. He remains on Remicade 5 mg/kg every 8 weeks for approximately 6 years. His colitis had been refractory to multiple medical therapies including mesalamine agents, prednisone (steroid-dependent). He was last seen in the office October 2016. At that time he was doing well. It was recommended that he schedule colonoscopy with biopsies. His last complete colonoscopy was in 2009 with flexible sigmoidoscopy 2011. He did not. He does remain on therapy. He continues to be clinically in remission. Describes one regular bowel movement per day. No abdominal pain, bleeding, or other issues. His treatment is provided via the [facility]. His blood work is up-to-date and has been unremarkable as has his testing for TB.  REVIEW OF SYSTEMS:  All non-GI ROS negative upon comprehensive and complete review  Past Medical History:  Diagnosis Date  . GERD (gastroesophageal reflux disease)   . Hemorrhage of rectum and anus   . Hemorrhoids   . Hiatal hernia   . Left sided ulcerative (chronic) colitis (HCC)     Past Surgical History:  Procedure Laterality Date  . KNEE ARTHROSCOPY    . SHOULDER SURGERY     right    Social History Bobby Belfastric W [place]  reports that he has never smoked. He has never used smokeless tobacco. He reports that he does not drink alcohol or use drugs.  family history includes Heart disease in his maternal grandfather.  No Known Allergies     PHYSICAL EXAMINATION: Vital signs: BP 122/68 (BP Location: Left Arm, Patient Position: Sitting, Cuff Size: Normal)   Pulse 68   Ht 6\' 2"  (1.88 m)   Wt 208 lb (94.3 kg)   BMI 26.71 kg/m   Constitutional: generally well-appearing, no acute distress Psychiatric: alert and oriented x3, cooperative Eyes: extraocular movements intact,  anicteric, conjunctiva pink Mouth: oral pharynx moist, no lesions Neck: supple no lymphadenopathy Cardiovascular: heart regular rate and rhythm, no murmur Lungs: clear to auscultation bilaterally Abdomen: soft, nontender, nondistended, no obvious ascites, no peritoneal signs, normal bowel sounds, no organomegaly Rectal:Deferred until colonoscopy Extremities: no clubbing cyanosis or lower extremity edema bilaterally Skin: no lesions on visible extremities Neuro: No focal deficits. Cranial nerves intact  ASSESSMENT:  #1. Long-standing left-sided ulcerative colitis. Remains in clinical remission on Remicade #2. Last complete colonoscopy 2009. Last flexible sigmoidoscopy 2011. Due for surveillance   PLAN:  #1. Continue Rem[facility]ade through [facility] #2. Outside blood work received and reviewed #3. Schedule surveillance colonoscopy. The patient states that he will do this after October #4. Routine office follow-up one year. Sooner if needed for questions or problems  25 minutes was spent face-to-face with the patient. Greater than 50% a time use for counseling regarding his also colitis, disease management, and disease monitoring as well as reviewing medication effects and side effects.

## 2015-10-12 NOTE — Patient Instructions (Signed)
Call us when you are ready to schedule your colonoscopy

## 2015-11-30 ENCOUNTER — Ambulatory Visit: Payer: Self-pay

## 2015-11-30 ENCOUNTER — Ambulatory Visit (INDEPENDENT_AMBULATORY_CARE_PROVIDER_SITE_OTHER): Payer: BC Managed Care – PPO | Admitting: Family Medicine

## 2015-11-30 ENCOUNTER — Encounter: Payer: Self-pay | Admitting: Family Medicine

## 2015-11-30 VITALS — BP 114/72 | HR 60 | Ht 75.0 in | Wt 197.0 lb

## 2015-11-30 DIAGNOSIS — M79642 Pain in left hand: Secondary | ICD-10-CM

## 2015-11-30 DIAGNOSIS — M130 Polyarthritis, unspecified: Secondary | ICD-10-CM

## 2015-11-30 DIAGNOSIS — K5150 Left sided colitis without complications: Secondary | ICD-10-CM

## 2015-11-30 MED ORDER — DICLOFENAC SODIUM 2 % TD SOLN: 2.0000 "application " | Freq: Two times a day (BID) | TRANSDERMAL | 3 refills | Status: DC

## 2015-11-30 MED ORDER — PREDNISONE 50 MG PO TABS: 50.0000 mg | ORAL_TABLET | Freq: Every day | ORAL | 0 refills | Status: DC

## 2015-11-30 MED ORDER — VITAMIN D (ERGOCALCIFEROL) 1.25 MG (50000 UNIT) PO CAPS: 50000.0000 [IU] | ORAL_CAPSULE | ORAL | 0 refills | Status: DC

## 2015-11-30 NOTE — Patient Instructions (Signed)
Good to see you  Does look like a flare.  Prednisone daily for 5 days.  Once weekly vitamin D pennsaid pinkie amount topically 2 times daily as needed.   Keep being active If not better in 2 weeks or swelling is worse please come see me.  Good luck with the house.

## 2015-11-30 NOTE — Assessment & Plan Note (Signed)
Past pedicle history significant for ulcerative colitis. Patient on ultrasound does have erosive change of the first MCP joint on the left hand. Mild swelling noted today. We discussed the possibility of injection which patient declined. Patient having more of a migratory and polyarthropathy patient was given prednisone and a higher dose. We discussed icing regimen, discussed over-the-counter medications but was given a prescription for vitamin D which has showed evidence to decrease flare secondary to ulcerative colitis, also given topical anti-inflammatory and we'll avoid oral anti-inflammatories secondary to his comorbidities. She'll try this and come back again in 2-3 weeks to see how patient is responding.

## 2015-11-30 NOTE — Progress Notes (Signed)
Bobby ScaleZach Medina D.O. [facility] [address] [place], [place] [address] Phone: [phone] Subjective:    CC: joint swelling   BJY:NWGNFAOZHYHPI:Subjective  Bobby Medina is a 40 y.o. male coming in with complaint of joint swelling. Patient's past mental history significant for ulcerative colitis. Patient states over the course last several weeks if not months he has had some times when his joints seem to be swelling. Started with the first large toe on the left foot and has moved from the elbow. Patient does lift a considerable amount of weight and goes to the gym regularly thinking he just had injuries. States though that now he has not been lifting and continues to have the intermittent swelling. Patient did see his rheumatologist with him having a past Bobby Medina history of ulcerative colitis. Most recent labs do not show any significant inflammatory markers at this time. Patient is on Remicade regularly.  Patient states no other significant changes in medication. Did have recent stress recently but overall nothing severe. States that it is uncomfortable but not stopping him from any activity shortly. States at the moment the only thing that seems to be swollen as the first MCP joint. Patient states that it's hard for him to move the index finger symptoms. Continues to be able to do daily activities but wants to be seen.     Past Medical History:  Diagnosis Date  . GERD (gastroesophageal reflux disease)   . Hemorrhage of rectum and anus   . Hemorrhoids   . Hiatal hernia   . Left sided ulcerative (chronic) colitis (HCC)    Past Surgical History:  Procedure Laterality Date  . KNEE ARTHROSCOPY    . SHOULDER SURGERY     right   Social History   Social History  . Marital status: Married    Spouse name: N/A  . Number of children: 2  . Years of education: N/A   Occupational History  . system analyst   . SR NETWORK ANALYST [employer]   Social History Main Topics  . Smoking  status: Never Smoker  . Smokeless tobacco: Never Used  . Alcohol use No  . Drug use: No  . Sexual activity: Not Asked   Other Topics Concern  . None   Social History Narrative  . None   No Known Allergies Family History  Problem Relation Age of Onset  . Heart disease Maternal Grandfather     Past medical history, social, surgical and family history all reviewed in electronic medical record.  No pertanent information unless stated regarding to the chief complaint.   Review of Systems: No headache, visual changes, nausea, vomiting, diarrhea, constipation, dizziness, abdominal pain, skin rash, fevers, chills, night sweats, weight loss, swollen lymph nodes, body aches, joint swelling, muscle aches, chest pain, shortness of breath, mood changes.   Objective  Blood pressure 114/72, pulse 60, height 6\' 3"  (1.905 m), weight 197 lb (89.4 kg), SpO2 98 %.  General: No apparent distress alert and oriented x3 mood and affect normal, dressed appropriately.  HEENT: Pupils equal, extraocular movements intact  Respiratory: Patient's speak in full sentences and does not appear short of breath  Cardiovascular: No lower extremity edema, non tender, no erythema  Skin: Warm dry intact with no signs of infection or rash on extremities or on axial skeleton.  Abdomen: Soft nontender  Neuro: Cranial nerves II through XII are intact, neurovascularly intact in all extremities with 2+ DTRs and 2+ pulses.  Lymph: No lymphadenopathy of  posterior or anterior cervical chain or axillae bilaterally.  Gait normal with good balance and coordination.  MSK:  Non tender with full range of motion and good stability and symmetric strength and tone of shoulders, elbows, wrist, hip, knee and ankles bilaterally. No swelling of any of the large joints.  First MCP joint does have swelling compared to the contralateral side. Seems to be soft tissue as well as the joint itself. Lacking the last 5 of extension and last 2 of  flexion. No angulation noted. Minor tenderness over the MCP joint. Good grip strength. Neurovascularly intact distally  Limited musculoskeletal ultrasound was performed and interpreted by Bobby Medina  Limited ultrasound patient's first MCP shows patient having erosive change of the bone with synovitis noted. Mild increase in Doppler flow noted. Impression: Erosive change of the first metatarsal joint with synovitis    Impression and Recommendations:     This case required medical decision making of moderate complexity.      Note: This dictation was prepared with Dragon dictation along with smaller phrase technology. Any transcriptional errors that result from this process are unintentional.

## 2016-01-28 DIAGNOSIS — Z6827 Body mass index (BMI) 27.0-27.9, adult: Secondary | ICD-10-CM | POA: Diagnosis not present

## 2016-01-28 DIAGNOSIS — M2550 Pain in unspecified joint: Secondary | ICD-10-CM | POA: Diagnosis not present

## 2016-01-28 DIAGNOSIS — K51811 Other ulcerative colitis with rectal bleeding: Secondary | ICD-10-CM | POA: Diagnosis not present

## 2016-01-28 DIAGNOSIS — E663 Overweight: Secondary | ICD-10-CM | POA: Diagnosis not present

## 2016-02-23 DIAGNOSIS — Z23 Encounter for immunization: Secondary | ICD-10-CM | POA: Diagnosis not present

## 2016-02-29 DIAGNOSIS — R509 Fever, unspecified: Secondary | ICD-10-CM | POA: Diagnosis not present

## 2016-02-29 DIAGNOSIS — B349 Viral infection, unspecified: Secondary | ICD-10-CM | POA: Diagnosis not present

## 2016-03-01 DIAGNOSIS — B349 Viral infection, unspecified: Secondary | ICD-10-CM | POA: Diagnosis not present

## 2016-03-04 DIAGNOSIS — B349 Viral infection, unspecified: Secondary | ICD-10-CM | POA: Diagnosis not present

## 2016-03-24 DIAGNOSIS — K5190 Ulcerative colitis, unspecified, without complications: Secondary | ICD-10-CM | POA: Diagnosis not present

## 2016-03-30 DIAGNOSIS — E663 Overweight: Secondary | ICD-10-CM | POA: Diagnosis not present

## 2016-03-30 DIAGNOSIS — K51811 Other ulcerative colitis with rectal bleeding: Secondary | ICD-10-CM | POA: Diagnosis not present

## 2016-03-30 DIAGNOSIS — Z6826 Body mass index (BMI) 26.0-26.9, adult: Secondary | ICD-10-CM | POA: Diagnosis not present

## 2016-03-30 DIAGNOSIS — M2550 Pain in unspecified joint: Secondary | ICD-10-CM | POA: Diagnosis not present

## 2016-05-16 ENCOUNTER — Encounter: Payer: Self-pay | Admitting: Internal Medicine

## 2016-05-19 DIAGNOSIS — K51811 Other ulcerative colitis with rectal bleeding: Secondary | ICD-10-CM | POA: Diagnosis not present

## 2016-05-19 DIAGNOSIS — K5190 Ulcerative colitis, unspecified, without complications: Secondary | ICD-10-CM | POA: Diagnosis not present

## 2016-06-06 DIAGNOSIS — L237 Allergic contact dermatitis due to plants, except food: Secondary | ICD-10-CM | POA: Diagnosis not present

## 2016-06-20 DIAGNOSIS — M791 Myalgia: Secondary | ICD-10-CM | POA: Diagnosis not present

## 2016-06-20 DIAGNOSIS — K5190 Ulcerative colitis, unspecified, without complications: Secondary | ICD-10-CM | POA: Diagnosis not present

## 2016-06-20 DIAGNOSIS — E7800 Pure hypercholesterolemia, unspecified: Secondary | ICD-10-CM | POA: Diagnosis not present

## 2016-06-20 DIAGNOSIS — Z0000 Encounter for general adult medical examination without abnormal findings: Secondary | ICD-10-CM | POA: Diagnosis not present

## 2016-06-21 DIAGNOSIS — E7800 Pure hypercholesterolemia, unspecified: Secondary | ICD-10-CM | POA: Diagnosis not present

## 2016-07-14 DIAGNOSIS — K5190 Ulcerative colitis, unspecified, without complications: Secondary | ICD-10-CM | POA: Diagnosis not present

## 2016-09-08 DIAGNOSIS — K5190 Ulcerative colitis, unspecified, without complications: Secondary | ICD-10-CM | POA: Diagnosis not present

## 2016-09-23 ENCOUNTER — Encounter: Payer: Self-pay | Admitting: Internal Medicine

## 2016-09-27 DIAGNOSIS — E663 Overweight: Secondary | ICD-10-CM | POA: Diagnosis not present

## 2016-09-27 DIAGNOSIS — Z6825 Body mass index (BMI) 25.0-25.9, adult: Secondary | ICD-10-CM | POA: Diagnosis not present

## 2016-09-27 DIAGNOSIS — M2550 Pain in unspecified joint: Secondary | ICD-10-CM | POA: Diagnosis not present

## 2016-09-27 DIAGNOSIS — K51811 Other ulcerative colitis with rectal bleeding: Secondary | ICD-10-CM | POA: Diagnosis not present

## 2016-10-25 ENCOUNTER — Ambulatory Visit (AMBULATORY_SURGERY_CENTER): Payer: Self-pay | Admitting: *Deleted

## 2016-10-25 VITALS — Ht 74.0 in | Wt 201.0 lb

## 2016-10-25 DIAGNOSIS — K5100 Ulcerative (chronic) pancolitis without complications: Secondary | ICD-10-CM

## 2016-10-25 MED ORDER — NA SULFATE-K SULFATE-MG SULF 17.5-3.13-1.6 GM/177ML PO SOLN: 1.0000 | Freq: Once | ORAL | 0 refills | Status: AC

## 2016-10-25 NOTE — Progress Notes (Signed)
No egg or soy allergy known to patient  No issues with past sedation with any surgeries  or procedures, no intubation problems  No diet pills per patient No home 02 use per patient  No blood thinners per patient  Pt denies issues with constipation  No A fib or A flutter  EMMI video sent to pt's e mail pt declined   

## 2016-10-28 ENCOUNTER — Encounter: Payer: Self-pay | Admitting: Internal Medicine

## 2016-11-03 DIAGNOSIS — K51811 Other ulcerative colitis with rectal bleeding: Secondary | ICD-10-CM | POA: Diagnosis not present

## 2016-11-03 DIAGNOSIS — K5190 Ulcerative colitis, unspecified, without complications: Secondary | ICD-10-CM | POA: Diagnosis not present

## 2016-11-08 ENCOUNTER — Ambulatory Visit (AMBULATORY_SURGERY_CENTER): Payer: 59 | Admitting: Internal Medicine

## 2016-11-08 ENCOUNTER — Encounter: Payer: Self-pay | Admitting: Internal Medicine

## 2016-11-08 VITALS — BP 102/61 | HR 54 | Temp 98.4°F | Resp 12 | Ht 74.0 in | Wt 201.0 lb

## 2016-11-08 DIAGNOSIS — K5100 Ulcerative (chronic) pancolitis without complications: Secondary | ICD-10-CM | POA: Diagnosis present

## 2016-11-08 DIAGNOSIS — K5190 Ulcerative colitis, unspecified, without complications: Secondary | ICD-10-CM | POA: Diagnosis not present

## 2016-11-08 MED ORDER — SODIUM CHLORIDE 0.9 % IV SOLN: 500.0000 mL | INTRAVENOUS | Status: DC

## 2016-11-08 NOTE — Progress Notes (Signed)
Cecal bxs and Right colon not left as stated were done today per Dr. Marina Goodell.

## 2016-11-08 NOTE — Progress Notes (Signed)
Pt's states no medical or surgical changes since previsit or office visit. 

## 2016-11-08 NOTE — Progress Notes (Signed)
Spontaneous respirations throughout. VSS. Resting comfortably. To PACU on room air. Report to  RN. 

## 2016-11-08 NOTE — Progress Notes (Signed)
Called to room to assist during endoscopic procedure.  Patient ID and intended procedure confirmed with present staff. Received instructions for my participation in the procedure from the performing physician.  

## 2016-11-08 NOTE — Op Note (Signed)
[facility] Patient Name: Bobby Medina Procedure Date: 11/08/2016 1:13 PM MRN: [id number] Endoscopist: Wilhemina Bonito. Marina Goodell , MD Age: 41 Referring MD:  Date of Birth: 18-Apr-1975 Gender: Male Account #: [id number] Procedure:                Colonoscopy, with biopsies Indications:              High risk colon cancer surveillance: Ulcerative                            left sided colitis of 10 (or more) years duration.                            In clinical remission on Remicade. Last complete                            colonoscopy 2009. Last flexible sigmoidoscopy 2011 Medicines:                Monitored Anesthesia Care Procedure:                Pre-Anesthesia Assessment:                           - Prior to the procedure, a History and Physical                            was performed, and patient medications and                            allergies were reviewed. The patient's tolerance of                            previous anesthesia was also reviewed. The risks                            and benefits of the procedure and the sedation                            options and risks were discussed with the patient.                            All questions were answered, and informed consent                            was obtained. Prior Anticoagulants: The patient has                            taken no previous anticoagulant or antiplatelet                            agents. ASA Grade Assessment: II - A patient with                            mild systemic disease. After reviewing the risks  and benefits, the patient was deemed in                            satisfactory condition to undergo the procedure.                           After obtaining informed consent, the colonoscope                            was passed under direct vision. Throughout the                            procedure, the patient's blood pressure, pulse, and   oxygen saturations were monitored continuously. The                            Colonoscope was introduced through the anus and                            advanced to the the cecum, identified by                            appendiceal orifice and ileocecal valve. The                            terminal ileum, ileocecal valve, appendiceal                            orifice, and rectum were photographed. The quality                            of the bowel preparation was good. The colonoscopy                            was performed without difficulty. The patient                            tolerated the procedure well. The bowel preparation                            used was SUPREP. Scope In: 1:41:47 PM Scope Out: 1:57:08 PM Scope Withdrawal Time: 0 hours 12 minutes 38 seconds  Total Procedure Duration: 0 hours 15 minutes 21 seconds  Findings:                 The terminal ileum appeared normal.                           The entire examined colon appeared normal on direct                            and retroflexion views. Two biopsies were taken                            every 10 cm with a cold forceps from  the entire                            colon for ulcerative colitis surveillance. These                            biopsy specimens were sent to Pathology. Complications:            No immediate complications. Estimated blood loss:                            None. Estimated Blood Loss:     Estimated blood loss: none. Impression:               - The examined portion of the ileum was normal.                           - The entire examined colon is normal on direct and                            retroflexion views.                           - Ulcerative colitis in remission Recommendation:           - Repeat colonoscopy in 5 years for surveillance.                           - Patient has a contact number available for                            emergencies. The signs and symptoms of  potential                            delayed complications were discussed with the                            patient. Return to normal activities tomorrow.                            Written discharge instructions were provided to the                            patient.                           - Resume previous diet.                           - Continue present medications.                           - Await pathology results.                           - Routine office follow-up with Dr. Marina Goodell in 1 year Wilhemina Bonito. Marina Goodell, MD 11/08/2016 2:03:42 PM This report has been signed electronically.

## 2016-11-08 NOTE — Patient Instructions (Signed)
**   Multiple biopsies were taken today. You will get results in 1-3 weeks. Make appt to see Dr. Marina Goodell in 1 year **   YOU HAD AN ENDOSCOPIC PROCEDURE TODAY AT THE [facility]:   Refer to the procedure report that was given to you for any specific questions about what was found during the examination.  If the procedure report does not answer your questions, please call your gastroenterologist to clarify.  If you requested that your care partner not be given the details of your procedure findings, then the procedure report has been included in a sealed envelope for you to review at your convenience later.  YOU SHOULD EXPECT: Some feelings of bloating in the abdomen. Passage of more gas than usual.  Walking can help get rid of the air that was put into your GI tract during the procedure and reduce the bloating. If you had a lower endoscopy (such as a colonoscopy or flexible sigmoidoscopy) you may notice spotting of blood in your stool or on the toilet paper. If you underwent a bowel prep for your procedure, you may not have a normal bowel movement for a few days.  Please Note:  You might notice some irritation and congestion in your nose or some drainage.  This is from the oxygen used during your procedure.  There is no need for concern and it should clear up in a day or so.  SYMPTOMS TO REPORT IMMEDIATELY:   Following lower endoscopy (colonoscopy or flexible sigmoidoscopy):  Excessive amounts of blood in the stool  Significant tenderness or worsening of abdominal pains  Swelling of the abdomen that is new, acute  Fever of 100F or higher  For urgent or emergent issues, a gastroenterologist can be reached at any hour by calling [phone].   DIET:  We do recommend a small meal at first, but then you may proceed to your regular diet.  Drink plenty of fluids but you should avoid alcoholic beverages for 24 hours.  ACTIVITY:  You should plan to take it easy for the rest of today and you  should NOT DRIVE or use heavy machinery until tomorrow (because of the sedation medicines used during the test).    FOLLOW UP: Our staff will call the number listed on your records the next business day following your procedure to check on you and address any questions or concerns that you may have regarding the information given to you following your procedure. If we do not reach you, we will leave a message.  However, if you are feeling well and you are not experiencing any problems, there is no need to return our call.  We will assume that you have returned to your regular daily activities without incident.  If any biopsies were taken you will be contacted by phone or by letter within the next 1-3 weeks.  Please call us at [phone] if you have not heard about the biopsies in 3 weeks.    SIGNATURES/CONFIDENTIALITY: You and/or your care partner have signed paperwork which will be entered into your electronic medical record.  These signatures attest to the fact that that the information above on your After Visit Summary has been reviewed and is understood.  Full responsibility of the confidentiality of this discharge information lies with you and/or your care-partner.

## 2016-11-09 ENCOUNTER — Telehealth: Payer: Self-pay

## 2016-11-09 NOTE — Telephone Encounter (Signed)
  Follow up Call-  Call back number 11/08/2016  Post procedure Call Back phone  # [phone]  Permission to leave phone message Yes  Some recent data might be hidden     Patient questions:  Do you have a fever, pain , or abdominal swelling? No. Pain Score  0 *  Have you tolerated food without any problems? Yes.    Have you been able to return to your normal activities? Yes.    Do you have any questions about your discharge instructions: Diet   No. Medications  No. Follow up visit  No.  Do you have questions or concerns about your Care? No.  Actions: * If pain score is 4 or above: No action needed, pain <4.  No problems noted per pt. maw

## 2016-11-14 ENCOUNTER — Encounter: Payer: Self-pay | Admitting: Internal Medicine

## 2016-11-29 ENCOUNTER — Ambulatory Visit (INDEPENDENT_AMBULATORY_CARE_PROVIDER_SITE_OTHER): Payer: 59 | Admitting: Internal Medicine

## 2016-11-29 ENCOUNTER — Encounter: Payer: Self-pay | Admitting: Internal Medicine

## 2016-11-29 VITALS — BP 118/70 | HR 94 | Ht 75.0 in | Wt 200.0 lb

## 2016-11-29 DIAGNOSIS — K5150 Left sided colitis without complications: Secondary | ICD-10-CM | POA: Diagnosis not present

## 2016-11-29 NOTE — Patient Instructions (Signed)
Please follow up in one year 

## 2016-11-29 NOTE — Progress Notes (Signed)
HISTORY OF PRESENT ILLNESS:  Bobby Medina is a 41 y.o. male diagnosed with left-sided ulcerative colitis 11 years ago. Previous patient of Dr. Sheryn Bisonavid Patterson. Established with me May 2015. At that time he was on Remicade 5 mg/kg every 8 weeks. His colitis have been refractory to multiple other agents as previously outlined. I saw him 2 weeks ago for his routine surveillance colonoscopy. The examination was grossly normal. Multiple biopsies were also normal. I noticed that he is taking sulfasalazine 1 g daily. Placed on this by a rheumatologist earlier this year for some joint aches. He does bring with him blood work from his [facility]. Most recent laboratories obtained 11/03/2016 were normal CBC and normal comprehensive metabolic panel. He had quadrant testing last year which was negative. He thinks that his infusions may perform this same test but is not sure. He was to check before submitting to blood work today. We reviewed his colonoscopic, pathologic, and laboratory findings.  REVIEW OF SYSTEMS:  All non-GI ROS negative upon comprehensive review  Past Medical History:  Diagnosis Date  . GERD (gastroesophageal reflux disease)   . Hemorrhage of rectum and anus   . Hemorrhoids   . Hiatal hernia   . Left sided ulcerative (chronic) colitis (HCC)     Past Surgical History:  Procedure Laterality Date  . COLONOSCOPY    . KNEE ARTHROSCOPY    . SHOULDER SURGERY     right    Social History Bobby Belfastric W Ybanez  reports that he has never smoked. He has never used smokeless tobacco. He reports that he does not drink alcohol or use drugs.  family history includes Heart disease in his maternal grandfather.  No Known Allergies     PHYSICAL EXAMINATION: Vital signs: BP 118/70   Pulse 94   Ht 6\' 3"  (1.905 m)   Wt 200 lb (90.7 kg)   BMI 25.00 kg/m   Constitutional: generally well-appearing, no acute distress Psychiatric: alert and oriented x3, cooperative Heart, lungs, abdomen: Not  reexamined. Just examined 2 weeks ago and unremarkable Skin: no lesions on visible extremities Neuro: No focal deficits. No asterixis.   ASSESSMENT:  #1. History of left-sided ulcerative colitis. Doing well on Remicade therapy. He is in clinical, endoscopic, and histologic remission   PLAN:  #1. Continue Remicade 5 mg/kg every 8 weeks #2. Continue routine blood work at infusion center #3. The patient will check to see if his TB testing is up-to-date. If not we will perform this test #4. Routine GI office follow-up one year #5. Repeat surveillance colonoscopy in 5 years.  15 minutes spent face-to-face with the patient. Greater than 50% a time use for counseling regarding his chronic ulcerative colitis

## 2016-12-29 DIAGNOSIS — K5190 Ulcerative colitis, unspecified, without complications: Secondary | ICD-10-CM | POA: Diagnosis not present

## 2016-12-29 DIAGNOSIS — Z79899 Other long term (current) drug therapy: Secondary | ICD-10-CM | POA: Diagnosis not present

## 2017-02-23 DIAGNOSIS — K5190 Ulcerative colitis, unspecified, without complications: Secondary | ICD-10-CM | POA: Diagnosis not present

## 2017-03-30 DIAGNOSIS — E663 Overweight: Secondary | ICD-10-CM | POA: Diagnosis not present

## 2017-03-30 DIAGNOSIS — M2550 Pain in unspecified joint: Secondary | ICD-10-CM | POA: Diagnosis not present

## 2017-03-30 DIAGNOSIS — K51811 Other ulcerative colitis with rectal bleeding: Secondary | ICD-10-CM | POA: Diagnosis not present

## 2017-03-30 DIAGNOSIS — Z6827 Body mass index (BMI) 27.0-27.9, adult: Secondary | ICD-10-CM | POA: Diagnosis not present

## 2017-04-20 ENCOUNTER — Encounter: Payer: Self-pay | Admitting: Internal Medicine

## 2017-04-20 DIAGNOSIS — Z79899 Other long term (current) drug therapy: Secondary | ICD-10-CM | POA: Diagnosis not present

## 2017-04-20 DIAGNOSIS — K5190 Ulcerative colitis, unspecified, without complications: Secondary | ICD-10-CM | POA: Diagnosis not present

## 2017-06-15 DIAGNOSIS — K5190 Ulcerative colitis, unspecified, without complications: Secondary | ICD-10-CM | POA: Diagnosis not present

## 2017-08-10 ENCOUNTER — Encounter: Payer: Self-pay | Admitting: Internal Medicine

## 2017-08-10 DIAGNOSIS — Z79899 Other long term (current) drug therapy: Secondary | ICD-10-CM | POA: Diagnosis not present

## 2017-08-10 DIAGNOSIS — K5190 Ulcerative colitis, unspecified, without complications: Secondary | ICD-10-CM | POA: Diagnosis not present

## 2017-09-28 DIAGNOSIS — E663 Overweight: Secondary | ICD-10-CM | POA: Diagnosis not present

## 2017-09-28 DIAGNOSIS — M2550 Pain in unspecified joint: Secondary | ICD-10-CM | POA: Diagnosis not present

## 2017-09-28 DIAGNOSIS — K51811 Other ulcerative colitis with rectal bleeding: Secondary | ICD-10-CM | POA: Diagnosis not present

## 2017-09-28 DIAGNOSIS — Z6827 Body mass index (BMI) 27.0-27.9, adult: Secondary | ICD-10-CM | POA: Diagnosis not present

## 2017-10-05 DIAGNOSIS — K5190 Ulcerative colitis, unspecified, without complications: Secondary | ICD-10-CM | POA: Diagnosis not present

## 2017-11-26 NOTE — Progress Notes (Signed)
[name] [address] [place], [place] [address] Phone: [phone] Subjective:    I Bobby Medina am serving as a Neurosurgeon for Dr. Antoine Primas.    CC: Left shoulder pain  BJY:NWGNFAOZHY  Bobby Medina is a 42 y.o. male coming in with complaint of left shoulder pain. Injured his shoulder during crossfit. Was doing muscle ups when he felt something "pop out". Believes he tore his pectoral muscle. Initially he had discoloration and swelling.   Onset- 1 month and a half Location- Pectoral Character-aching sensation Aggravating factors- Push, pull, using the bicep  Reliving factors-  Therapies tried- Ice, heat, medication Severity-5 out of 10     Past Medical History:  Diagnosis Date  . GERD (gastroesophageal reflux disease)   . Hemorrhage of rectum and anus   . Hemorrhoids   . Hiatal hernia   . Left sided ulcerative (chronic) colitis (HCC)    Past Surgical History:  Procedure Laterality Date  . COLONOSCOPY    . KNEE ARTHROSCOPY    . SHOULDER SURGERY     right   Social History   Socioeconomic History  . Marital status: Married    Spouse name: Not on file  . Number of children: 2  . Years of education: Not on file  . Highest education level: Not on file  Occupational History  . Occupation: Radio producer  . Occupation: SR NETWORK ANALYST    Employer: [employer]  Social Needs  . Financial resource strain: Not on file  . Food insecurity:    Worry: Not on file    Inability: Not on file  . Transportation needs:    Medical: Not on file    Non-medical: Not on file  Tobacco Use  . Smoking status: Never Smoker  . Smokeless tobacco: Never Used  Substance and Sexual Activity  . Alcohol use: No  . Drug use: No  . Sexual activity: Yes    Partners: Female  Lifestyle  . Physical activity:    Days per week: Not on file    Minutes per session: Not on file  . Stress: Not on file  Relationships  . Social connections:   Talks on phone: Not on file    Gets together: Not on file    Attends religious service: Not on file    Active member of club or organization: Not on file    Attends meetings of clubs or organizations: Not on file    Relationship status: Not on file  Other Topics Concern  . Not on file  Social History Narrative  . Not on file   No Known Allergies Family History  Problem Relation Age of Onset  . Heart disease Maternal Grandfather   . Colon cancer Neg Hx   . Colon polyps Neg Hx   . Esophageal cancer Neg Hx   . Rectal cancer Neg Hx   . Stomach cancer Neg Hx      Current Outpatient Medications (Cardiovascular):  .  nitroGLYCERIN (NITRODUR - DOSED IN MG/24 HR) 0.2 mg/hr patch, 1/4 patch daily     Current Outpatient Medications (Other):  .  acyclovir (ZOVIRAX) 200 MG capsule, as needed. .  inFLIXimab (REMICADE) 100 MG injection, Inject 5 mg/kg into the vein every 8 (eight) weeks.  Marland Kitchen  sulfaSALAzine (AZULFIDINE) 500 MG tablet, Take 500 mg by mouth daily.     Past medical history, social, surgical and family history all reviewed in electronic medical record.  No pertanent  information unless stated regarding to the chief complaint.   Review of Systems:  No headache, visual changes, nausea, vomiting, diarrhea, constipation, dizziness, abdominal pain, skin rash, fevers, chills, night sweats, weight loss, swollen lymph nodes, body aches, joint swelling, muscle aches, chest pain, shortness of breath, mood changes.   Objective  Blood pressure 110/80, pulse (!) 56, height 6\' 3"  (1.905 m), weight 210 lb (95.3 kg), SpO2 98 %.    General: No apparent distress alert and oriented x3 mood and affect normal, dressed appropriately.  HEENT: Pupils equal, extraocular movements intact  Respiratory: Patient's speak in full sentences and does not appear short of breath  Cardiovascular: No lower extremity edema, non tender, no erythema  Skin: Warm dry intact with no signs of infection or rash on  extremities or on axial skeleton.  Abdomen: Soft nontender  Neuro: Cranial nerves II through XII are intact, neurovascularly intact in all extremities with 2+ DTRs and 2+ pulses.  Lymph: No lymphadenopathy of posterior or anterior cervical chain or axillae bilaterally.  Gait normal with good balance and coordination.  MSK:  Non tender with full range of motion and good stability and symmetric strength and tone of  elbows, wrist, hip, knee and ankles bilaterally.  Left shoulder exam shows that patient has near full range of motion.  Patient does have been deformity noted of the left temporalis muscle.  Against resistance patient's defect seems to enlarge.  Does have mild positive speeds test.  Rotator cuff strength 5 out of 5.  Negative O'Brien's.  Neurovascularly intact distally.  MSK US performed of: Left shoulder  this study was ordered, performed, and interpreted by Terrilee Files D.O.  Shoulder:   Supraspinatus:  Appears normal on long and transverse views, no bursal bulge seen with shoulder abduction on impingement view. Subscapularis:  Appears normal on long and transverse views. Teres Minor:  Appears normal on long and transverse views. AC joint: Capsule distended Glenohumeral Joint:  Appears normal without effusion. Glenoid Labrum:  Intact without visualized tears. Biceps Tendon: Hypoechoic changes noted with mild subluxation Patient's left pectoralis muscle approximately 3 cm from tendon does have a muscular tear noted.  He does appear to have some healing but decreased vascularity  Impression: Pectoralis tear and bicep tendinitis  [id number]; 15 additional minutes spent for Therapeutic exercises as stated in above notes.  This included exercises focusing on stretching, strengthening, with significant focus on eccentric aspects.   Long term goals include an improvement in range of motion, strength, endurance as well as avoiding reinjury. Patient's frequency would include in 1-2 times a day, 3-5  times a week for a duration of 6-12 weeks.  Shoulder Exercises that included:  Basic scapular stabilization to include adduction and depression of scapula Scaption, focusing on proper movement and good control Internal and External rotation utilizing a theraband, with elbow tucked at side entire time Rows with theraband which was given  Proper technique shown and discussed handout in great detail with ATC.  All questions were discussed and answered.       Impression and Recommendations:     This case required medical decision making of moderate complexity. The above documentation has been reviewed and is accurate and complete Judi Saa, DO       Note: This dictation was prepared with Dragon dictation along with smaller phrase technology. Any transcriptional errors that result from this process are unintentional.

## 2017-11-27 ENCOUNTER — Ambulatory Visit: Payer: 59 | Admitting: Family Medicine

## 2017-11-28 ENCOUNTER — Ambulatory Visit: Payer: 59 | Admitting: Family Medicine

## 2017-11-28 ENCOUNTER — Ambulatory Visit: Payer: Self-pay

## 2017-11-28 VITALS — BP 110/80 | HR 56 | Ht 75.0 in | Wt 210.0 lb

## 2017-11-28 DIAGNOSIS — G8929 Other chronic pain: Secondary | ICD-10-CM | POA: Diagnosis not present

## 2017-11-28 DIAGNOSIS — M7522 Bicipital tendinitis, left shoulder: Secondary | ICD-10-CM | POA: Diagnosis not present

## 2017-11-28 DIAGNOSIS — S29011A Strain of muscle and tendon of front wall of thorax, initial encounter: Secondary | ICD-10-CM

## 2017-11-28 DIAGNOSIS — M25512 Pain in left shoulder: Principal | ICD-10-CM

## 2017-11-28 MED ORDER — NITROGLYCERIN 0.2 MG/HR TD PT24: MEDICATED_PATCH | TRANSDERMAL | 1 refills | Status: DC

## 2017-11-28 NOTE — Patient Instructions (Addendum)
Good to see you  Ice 20 minutes 2 times daily. Usually after activity and before bed. pennsaid pinkie amount topically 2 times daily as needed.  Exercises 3 times a week.   Nitroglycerin Protocol   Apply 1/4 nitroglycerin patch to affected area daily.  Change position of patch within the affected area every 24 hours.  You may experience a headache during the first 1-2 weeks of using the patch, these should subside.  If you experience headaches after beginning nitroglycerin patch treatment, you may take your preferred over the counter pain reliever.  Another side effect of the nitroglycerin patch is skin irritation or rash related to patch adhesive.  Please notify our office if you develop more severe headaches or rash, and stop the patch.  Tendon healing with nitroglycerin patch may require 12 to 24 weeks depending on the extent of injury.  Men should not use if taking Viagra, Cialis, or Levitra.   Do not use if you have migraines or rosacea.  No glamour muscles See me again in 4--6 weeks

## 2017-11-28 NOTE — Assessment & Plan Note (Signed)
Compression sleeve, home discussed home exercise, topical anti-inflammatories and nitroglycerin.  Follow-up again in 4 weeks

## 2017-11-28 NOTE — Assessment & Plan Note (Signed)
Pectoralis muscle tear.  Partial.  Discussed home exercises, started nitroglycerin, discussed icing regimen, possible PRP may be necessary.  Follow-up again in 4 weeks

## 2017-11-30 DIAGNOSIS — K5190 Ulcerative colitis, unspecified, without complications: Secondary | ICD-10-CM | POA: Diagnosis not present

## 2017-12-04 ENCOUNTER — Encounter: Payer: Self-pay | Admitting: Internal Medicine

## 2018-01-02 NOTE — Progress Notes (Signed)
Tawana ScaleZach Raevin Wierenga D.O. [facility] [address] [place], [place] [address] Phone: [phone] Subjective:   Bobby Medina, Bobby Medina, am serving as a scribe for Dr. Antoine PrimasZachary Rodina Pinales.   CC: Left pec tear follow-up  OZH:[id number]:Subjective  Bobby Medina is a 42 y.o. male coming in with complaint of pec pain. Patient feels tightness in the pec from the contralateral side. No pain since last visit. Patient was experiencing epistaxis in waiting room. Is not feeling dizzy and was able to ambulate back to room with pressure below nasal bone.  Patient though states that overall doing better.  Would state 75% better.  States that the severe pain went away after about a week or so.  Had headaches with the nitroglycerin for a week but since then has not had any other side effects.      Past Medical History:  Diagnosis Date  . GERD (gastroesophageal reflux disease)   . Hemorrhage of rectum and anus   . Hemorrhoids   . Hiatal hernia   . Left sided ulcerative (chronic) colitis (HCC)    Past Surgical History:  Procedure Laterality Date  . COLONOSCOPY    . KNEE ARTHROSCOPY    . SHOULDER SURGERY     right   Social History   Socioeconomic History  . Marital status: Married    Spouse name: Not on file  . Number of children: 2  . Years of education: Not on file  . Highest education level: Not on file  Occupational History  . Occupation: Radio producersystem analyst  . Occupation: SR NETWORK ANALYST    [employer]  Social Needs  . Financial resource strain: Not on file  . Food insecurity:    Worry: Not on file    Inability: Not on file  . Transportation needs:    Medical: Not on file    Non-medical: Not on file  Tobacco Use  . Smoking status: Never Smoker  . Smokeless tobacco: Never Used  Substance and Sexual Activity  . Alcohol use: No  . Drug use: No  . Sexual activity: Yes    Partners: Female  Lifestyle  . Physical activity:    Days per week: Not on file    Minutes per session: Not  on file  . Stress: Not on file  Relationships  . Social connections:    Talks on phone: Not on file    Gets together: Not on file    Attends religious service: Not on file    Active member of club or organization: Not on file    Attends meetings of clubs or organizations: Not on file    Relationship status: Not on file  Other Topics Concern  . Not on file  Social History Narrative  . Not on file   No Known Allergies Family History  Problem Relation Age of Onset  . Heart disease Maternal Grandfather   . Colon cancer Neg Hx   . Colon polyps Neg Hx   . Esophageal cancer Neg Hx   . Rectal cancer Neg Hx   . Stomach cancer Neg Hx      Current Outpatient Medications (Cardiovascular):  .  nitroGLYCERIN (NITRODUR - DOSED IN MG/24 HR) 0.2 mg/hr patch, 1/4 patch daily     Current Outpatient Medications (Other):  .  acyclovir (ZOVIRAX) 200 MG capsule, as needed. .  inFLIXimab (REMICADE) 100 MG injection, Inject 5 mg/kg into the vein every 8 (eight) weeks.  Marland Kitchen.  sulfaSALAzine (AZULFIDINE) 500 MG tablet,  Take 500 mg by mouth daily.     Past medical history, social, surgical and family history all reviewed in electronic medical record.  No pertanent information unless stated regarding to the chief complaint.   Review of Systems:  No headache, visual changes, nausea, vomiting, diarrhea, constipation, dizziness, abdominal pain, skin rash, fevers, chills, night sweats, weight loss, swollen lymph nodes, body aches, joint swelling,  chest pain, shortness of breath, mood changes.  Positive muscle aches  Objective  Height 6\' 3"  (1.905 m).   General: No apparent distress alert and oriented x3 mood and affect normal, dressed appropriately.  HEENT: Pupils equal, extraocular movements intact  Respiratory: Patient's speak in full sentences and does not appear short of breath  Cardiovascular: No lower extremity edema, non tender, no erythema  Skin: Warm dry intact with no signs of infection or  rash on extremities or on axial skeleton.  Abdomen: Soft nontender  Neuro: Cranial nerves II through XII are intact, neurovascularly intact in all extremities with 2+ DTRs and 2+ pulses.  Lymph: No lymphadenopathy of posterior or anterior cervical chain or axillae bilaterally.  Gait normal with good balance and coordination.  MSK:  Non tender with full range of motion and good stability and symmetric strength and tone of , elbows, wrist, hip, knee and ankles bilaterally.   Left shoulder exam shows the patient does have some tightness still with adduction of the arm with some tenderness but patient's back does seem to fire.  Minimal impingement noted.  Bicep tendon seems to be intact.  Limited musculoskeletal ultrasound was performed and interpreted by Judi Saa  Limited ultrasound of patient's pectoralis muscle on the left side shows that there still is a defect noted at the musculotendinous junction.  Retraction that was noted previously has improved.  Scar tissue formation in mild neovascularization also noted.  Patient does have what appears to be a small seroma though in the area. Impression: Interval healing of a pectoralis tear   Impression and Recommendations:      The above documentation has been reviewed and is accurate and complete Judi Saa, DO       Note: This dictation was prepared with Dragon dictation along with smaller phrase technology. Any transcriptional errors that result from this process are unintentional.

## 2018-01-03 ENCOUNTER — Ambulatory Visit: Payer: Self-pay

## 2018-01-03 ENCOUNTER — Ambulatory Visit: Payer: 59 | Admitting: Family Medicine

## 2018-01-03 VITALS — Ht 75.0 in

## 2018-01-03 DIAGNOSIS — M25512 Pain in left shoulder: Secondary | ICD-10-CM

## 2018-01-03 DIAGNOSIS — S29011D Strain of muscle and tendon of front wall of thorax, subsequent encounter: Secondary | ICD-10-CM | POA: Diagnosis not present

## 2018-01-03 NOTE — Assessment & Plan Note (Signed)
Improvement noted.  Still has a defect in the muscular tendon juncture.  We discussed icing regimen and home exercise We discussed avoiding certain activities still for another month.  Patient will continue the nitroglycerin.  Follow-up again in 6 weeks

## 2018-01-03 NOTE — Patient Instructions (Addendum)
Great to see you  Gustavus Bryantce is your friend  Joen LauraLets continue the nitro  Keep hands within peripheral vison and increase activity as tolerated  See me again in 6 weeks to make sure you are good to go!

## 2018-01-28 DIAGNOSIS — J019 Acute sinusitis, unspecified: Secondary | ICD-10-CM | POA: Diagnosis not present

## 2018-02-12 DIAGNOSIS — J069 Acute upper respiratory infection, unspecified: Secondary | ICD-10-CM | POA: Diagnosis not present

## 2018-02-14 ENCOUNTER — Ambulatory Visit: Payer: 59 | Admitting: Family Medicine

## 2018-02-14 DIAGNOSIS — Z79899 Other long term (current) drug therapy: Secondary | ICD-10-CM | POA: Diagnosis not present

## 2018-02-14 DIAGNOSIS — K5190 Ulcerative colitis, unspecified, without complications: Secondary | ICD-10-CM | POA: Diagnosis not present

## 2018-02-15 ENCOUNTER — Ambulatory Visit: Payer: 59 | Admitting: Family Medicine

## 2018-02-15 ENCOUNTER — Encounter: Payer: Self-pay | Admitting: Family Medicine

## 2018-02-15 DIAGNOSIS — S29011D Strain of muscle and tendon of front wall of thorax, subsequent encounter: Secondary | ICD-10-CM

## 2018-02-15 NOTE — Progress Notes (Signed)
[name] [address] [place], [place] [address] Phone: [phone] Subjective:    I Bobby Medina am serving as a Neurosurgeon for Dr. Antoine Primas.   CC: Left shoulder pain  AQT:[id number]  Bobby Medina is a 43 y.o. male coming in with complaint of left shoulder pain. States that his shoulder is a little tight in some areas. Feels good overall.  Patient was seen previously and had more of a pectoralis tear.  Patient has been increasing activity.  Unable to tolerate the nitroglycerin so discontinued.  Has been feeling pretty good.  Daily activities has been fine.  Patient does not notice any of the bicep pain at the moment.      Past Medical History:  Diagnosis Date  . GERD (gastroesophageal reflux disease)   . Hemorrhage of rectum and anus   . Hemorrhoids   . Hiatal hernia   . Left sided ulcerative (chronic) colitis (HCC)    Past Surgical History:  Procedure Laterality Date  . COLONOSCOPY    . KNEE ARTHROSCOPY    . SHOULDER SURGERY     right   Social History   Socioeconomic History  . Marital status: Married    Spouse name: Not on file  . Number of children: 2  . Years of education: Not on file  . Highest education level: Not on file  Occupational History  . Occupation: Radio producer  . Occupation: SR NETWORK ANALYST    Employer: [employer]  Social Needs  . Financial resource strain: Not on file  . Food insecurity:    Worry: Not on file    Inability: Not on file  . Transportation needs:    Medical: Not on file    Non-medical: Not on file  Tobacco Use  . Smoking status: Never Smoker  . Smokeless tobacco: Never Used  Substance and Sexual Activity  . Alcohol use: No  . Drug use: No  . Sexual activity: Yes    Partners: Female  Lifestyle  . Physical activity:    Days per week: Not on file    Minutes per session: Not on file  . Stress: Not on file  Relationships  . Social connections:    Talks on phone: Not on file      Gets together: Not on file    Attends religious service: Not on file    Active member of club or organization: Not on file    Attends meetings of clubs or organizations: Not on file    Relationship status: Not on file  Other Topics Concern  . Not on file  Social History Narrative  . Not on file   No Known Allergies Family History  Problem Relation Age of Onset  . Heart disease Maternal Grandfather   . Colon cancer Neg Hx   . Colon polyps Neg Hx   . Esophageal cancer Neg Hx   . Rectal cancer Neg Hx   . Stomach cancer Neg Hx      Current Outpatient Medications (Cardiovascular):  .  nitroGLYCERIN (NITRODUR - DOSED IN MG/24 HR) 0.2 mg/hr patch, 1/4 patch daily     Current Outpatient Medications (Other):  .  acyclovir (ZOVIRAX) 200 MG capsule, as needed. .  inFLIXimab (REMICADE) 100 MG injection, Inject 5 mg/kg into the vein every 8 (eight) weeks.  Marland Kitchen  sulfaSALAzine (AZULFIDINE) 500 MG tablet, Take 500 mg by mouth daily.     Past medical history, social, surgical and family history  all reviewed in electronic medical record.  No pertanent information unless stated regarding to the chief complaint.   Review of Systems:  No headache, visual changes, nausea, vomiting, diarrhea, constipation, dizziness, abdominal pain, skin rash, fevers, chills, night sweats, weight loss, swollen lymph nodes, body aches, joint swelling, muscle aches, chest pain, shortness of breath, mood changes.   Objective  Blood pressure 126/80, pulse (!) 59, height 6\' 3"  (1.905 m), weight 216 lb (98 kg), SpO2 97 %.   General: No apparent distress alert and oriented x3 mood and affect normal, dressed appropriately.  HEENT: Pupils equal, extraocular movements intact  Respiratory: Patient's speak in full sentences and does not appear short of breath  Cardiovascular: No lower extremity edema, non tender, no erythema  Skin: Warm dry intact with no signs of infection or rash on extremities or on axial skeleton.   Abdomen: Soft nontender  Neuro: Cranial nerves II through XII are intact, neurovascularly intact in all extremities with 2+ DTRs and 2+ pulses.  Lymph: No lymphadenopathy of posterior or anterior cervical chain or axillae bilaterally.  Gait normal with good balance and coordination.  MSK:  Non tender with full range of motion and good stability and symmetric strength and tone of  elbows, wrist, hip, knee and ankles bilaterally.  Left shoulder exam shows the patient does have full range of motion of the shoulder.  Still some pain with resisted adduction.  Patient still does not have any type defect noted in the pectoralis muscle.  Patient still moderately weak on the left side compared to the right.  Bicep tendon significantly improved from previous exam with negative speeds and Yergason's.  No signs of impingement of the shoulder noted    Impression and Recommendations:     . The above documentation has been reviewed and is accurate and complete Judi Saa, DO       Note: This dictation was prepared with Dragon dictation along with smaller phrase technology. Any transcriptional errors that result from this process are unintentional.

## 2018-02-15 NOTE — Assessment & Plan Note (Signed)
Doing much better at this time.  Patient is now 10 weeks out from the injury.  I believe the patient should do fairly well.  Possibility is the immune modulator could increase her risk of some possible injuries.  We discussed low impact exercises.  Discontinue nitroglycerin completely secondary to side effects.  Patient will increase activity slowly and have follow-up again in 6 weeks if any residual pain exist

## 2018-03-30 DIAGNOSIS — Z6827 Body mass index (BMI) 27.0-27.9, adult: Secondary | ICD-10-CM | POA: Diagnosis not present

## 2018-03-30 DIAGNOSIS — M2550 Pain in unspecified joint: Secondary | ICD-10-CM | POA: Diagnosis not present

## 2018-03-30 DIAGNOSIS — R945 Abnormal results of liver function studies: Secondary | ICD-10-CM | POA: Diagnosis not present

## 2018-03-30 DIAGNOSIS — K51811 Other ulcerative colitis with rectal bleeding: Secondary | ICD-10-CM | POA: Diagnosis not present

## 2018-03-30 DIAGNOSIS — E663 Overweight: Secondary | ICD-10-CM | POA: Diagnosis not present

## 2018-04-13 DIAGNOSIS — K5190 Ulcerative colitis, unspecified, without complications: Secondary | ICD-10-CM | POA: Diagnosis not present

## 2018-05-11 DIAGNOSIS — L239 Allergic contact dermatitis, unspecified cause: Secondary | ICD-10-CM | POA: Diagnosis not present

## 2018-06-11 DIAGNOSIS — Z6827 Body mass index (BMI) 27.0-27.9, adult: Secondary | ICD-10-CM | POA: Diagnosis not present

## 2018-06-11 DIAGNOSIS — K5190 Ulcerative colitis, unspecified, without complications: Secondary | ICD-10-CM | POA: Diagnosis not present

## 2018-06-18 DIAGNOSIS — L247 Irritant contact dermatitis due to plants, except food: Secondary | ICD-10-CM | POA: Diagnosis not present

## 2018-07-16 DIAGNOSIS — L2389 Allergic contact dermatitis due to other agents: Secondary | ICD-10-CM | POA: Diagnosis not present

## 2018-08-06 DIAGNOSIS — K5190 Ulcerative colitis, unspecified, without complications: Secondary | ICD-10-CM | POA: Diagnosis not present

## 2018-08-06 DIAGNOSIS — Z79899 Other long term (current) drug therapy: Secondary | ICD-10-CM | POA: Diagnosis not present

## 2018-08-23 DIAGNOSIS — Z20828 Contact with and (suspected) exposure to other viral communicable diseases: Secondary | ICD-10-CM | POA: Diagnosis not present

## 2018-08-23 DIAGNOSIS — R110 Nausea: Secondary | ICD-10-CM | POA: Diagnosis not present

## 2018-08-23 DIAGNOSIS — R5383 Other fatigue: Secondary | ICD-10-CM | POA: Diagnosis not present

## 2018-08-23 DIAGNOSIS — R51 Headache: Secondary | ICD-10-CM | POA: Diagnosis not present

## 2018-09-28 DIAGNOSIS — E663 Overweight: Secondary | ICD-10-CM | POA: Diagnosis not present

## 2018-09-28 DIAGNOSIS — R945 Abnormal results of liver function studies: Secondary | ICD-10-CM | POA: Diagnosis not present

## 2018-09-28 DIAGNOSIS — K51811 Other ulcerative colitis with rectal bleeding: Secondary | ICD-10-CM | POA: Diagnosis not present

## 2018-09-28 DIAGNOSIS — M2550 Pain in unspecified joint: Secondary | ICD-10-CM | POA: Diagnosis not present

## 2018-09-28 DIAGNOSIS — Z6826 Body mass index (BMI) 26.0-26.9, adult: Secondary | ICD-10-CM | POA: Diagnosis not present

## 2018-10-01 DIAGNOSIS — K5190 Ulcerative colitis, unspecified, without complications: Secondary | ICD-10-CM | POA: Diagnosis not present

## 2019-09-11 ENCOUNTER — Other Ambulatory Visit: Payer: Self-pay

## 2019-09-11 ENCOUNTER — Telehealth: Payer: Self-pay

## 2019-09-11 DIAGNOSIS — K5150 Left sided colitis without complications: Secondary | ICD-10-CM

## 2019-09-11 DIAGNOSIS — R7989 Other specified abnormal findings of blood chemistry: Secondary | ICD-10-CM

## 2019-09-11 NOTE — Telephone Encounter (Signed)
Spoke with pt and he is aware and will come for labs, orders in epic. Pt scheduled to see Dr. Marina Goodell 10/02/19@8 :20am. Pt aware of appt.

## 2019-09-11 NOTE — Telephone Encounter (Signed)
-----   Message from Bobby Fredrickson, MD sent at 09/10/2019  5:37 PM EDT ----- Regarding: Needs blood work and office visit Lauren Aguayo,Jacquel has a history of IBD and receives biologic infusion therapy at an outside facility.  The facility routinely since his blood work, which has generally been fine.  I have reviewed the most recent blood work dated August 12, 2019.  He is noted to have mildly elevated hepatic transaminases (AST 53, ALT 83) and slightly low platelets, 147,000.  I have not seen him in the office since 2018.Please Jered get a CBC as well as LFTs and schedule him to see me for an office appointment.  Please convert this to a phone note for future reference.  Aviva Kluver

## 2019-09-30 ENCOUNTER — Other Ambulatory Visit (INDEPENDENT_AMBULATORY_CARE_PROVIDER_SITE_OTHER): Payer: Self-pay

## 2019-09-30 DIAGNOSIS — K5150 Left sided colitis without complications: Secondary | ICD-10-CM

## 2019-09-30 DIAGNOSIS — R7989 Other specified abnormal findings of blood chemistry: Secondary | ICD-10-CM

## 2019-09-30 LAB — CBC WITH DIFFERENTIAL/PLATELET
Basophils Absolute: 0.1 10*3/uL (ref 0.0–0.1)
Basophils Relative: 2.1 % (ref 0.0–3.0)
Eosinophils Absolute: 0.3 10*3/uL (ref 0.0–0.7)
Eosinophils Relative: 6.1 % — ABNORMAL HIGH (ref 0.0–5.0)
HCT: 46 % (ref 39.0–52.0)
Hemoglobin: 15.8 g/dL (ref 13.0–17.0)
Lymphocytes Relative: 48.6 % — ABNORMAL HIGH (ref 12.0–46.0)
Lymphs Abs: 2.7 10*3/uL (ref 0.7–4.0)
MCHC: 34.3 g/dL (ref 30.0–36.0)
MCV: 93.6 fl (ref 78.0–100.0)
Monocytes Absolute: 0.5 10*3/uL (ref 0.1–1.0)
Monocytes Relative: 8.6 % (ref 3.0–12.0)
Neutro Abs: 1.9 10*3/uL (ref 1.4–7.7)
Neutrophils Relative %: 34.6 % — ABNORMAL LOW (ref 43.0–77.0)
Platelets: 162 10*3/uL (ref 150.0–400.0)
RBC: 4.92 Mil/uL (ref 4.22–5.81)
RDW: 12.9 % (ref 11.5–15.5)
WBC: 5.6 10*3/uL (ref 4.0–10.5)

## 2019-09-30 LAB — HEPATIC FUNCTION PANEL
ALT: 17 U/L (ref 0–53)
AST: 20 U/L (ref 0–37)
Albumin: 4.6 g/dL (ref 3.5–5.2)
Alkaline Phosphatase: 65 U/L (ref 39–117)
Bilirubin, Direct: 0.1 mg/dL (ref 0.0–0.3)
Total Bilirubin: 0.4 mg/dL (ref 0.2–1.2)
Total Protein: 7 g/dL (ref 6.0–8.3)

## 2019-10-02 ENCOUNTER — Ambulatory Visit: Payer: 59 | Admitting: Internal Medicine

## 2019-10-08 DIAGNOSIS — K5190 Ulcerative colitis, unspecified, without complications: Secondary | ICD-10-CM | POA: Diagnosis not present

## 2019-10-11 ENCOUNTER — Ambulatory Visit: Payer: BC Managed Care – PPO | Admitting: Internal Medicine

## 2019-10-11 ENCOUNTER — Encounter: Payer: Self-pay | Admitting: Internal Medicine

## 2019-10-11 VITALS — BP 98/64 | HR 54 | Ht 74.0 in | Wt 216.0 lb

## 2019-10-11 DIAGNOSIS — K5150 Left sided colitis without complications: Secondary | ICD-10-CM

## 2019-10-11 DIAGNOSIS — Z7189 Other specified counseling: Secondary | ICD-10-CM

## 2019-10-11 DIAGNOSIS — D696 Thrombocytopenia, unspecified: Secondary | ICD-10-CM

## 2019-10-11 DIAGNOSIS — Z23 Encounter for immunization: Secondary | ICD-10-CM | POA: Diagnosis not present

## 2019-10-11 DIAGNOSIS — R7989 Other specified abnormal findings of blood chemistry: Secondary | ICD-10-CM

## 2019-10-11 DIAGNOSIS — Z7185 Encounter for immunization safety counseling: Secondary | ICD-10-CM

## 2019-10-11 NOTE — Patient Instructions (Signed)
You have been given the first of 3 Twinrix injections.  Your next injection is ____________________

## 2019-10-11 NOTE — Progress Notes (Signed)
HISTORY OF PRESENT ILLNESS:  Bobby Medina is a 44 y.o. male, previous patient of Dr. Sheryn Bison, with longstanding left-sided ulcerative colitis (diagnosed 14 years ago) who establish care with me in May 2015.  At that time he was on Remicade 5 mg/kg every 8 weeks after his colitis has been refractory to multiple other agents as previously outlined.  He was last seen in this office November 29, 2016.  See that dictation for details.  He was to follow-up in 1 year, but did not follow-up as recommended.  He has continued with his Remicade infusions regularly.  He is tolerating without difficulty.  His outpatient infusion center sends me his blood work.  Recent blood work from that center dated August 12, 2019 revealed several lab abnormalities including mild thrombocytopenia (platelets 147,000), and elevated transaminases with AST 53 and ALT 83.  Normal alkaline phosphatase and bilirubin.  Normal protein, albumin, and globulins.  I asked him to submit to repeat blood work and schedule a follow-up appointment.  Repeat blood work on September 30, 2019 shows normalization of both liver tests and platelet count.  Since his last visit, he tells me that he has been doing well.  Regular bowel movements without bleeding.  No extraintestinal manifestations of inflammatory bowel disease on detailed questioning.  He did have Covid infection last year.  His case was relatively mild without residual effects.  He has not been vaccinated to Covid, hepatitis A or B, for this years influenza.  His last complete colonoscopy with ileal intubation was performed November 08, 2016.  Examinations normal including surveillance colon biopsies throughout.  Routine follow-up in 5 years recommended.  REVIEW OF SYSTEMS:  All non-GI ROS negative entirely  Past Medical History:  Diagnosis Date  . GERD (gastroesophageal reflux disease)   . Hemorrhage of rectum and anus   . Hemorrhoids   . Hiatal hernia   . Left sided ulcerative (chronic)  colitis (HCC)     Past Surgical History:  Procedure Laterality Date  . COLONOSCOPY    . KNEE ARTHROSCOPY    . SHOULDER SURGERY     right    Social History Bobby Medina  reports that he has never smoked. He has never used smokeless tobacco. He reports that he does not drink alcohol and does not use drugs.  family history includes Heart disease in his maternal grandfather.  No Known Allergies     PHYSICAL EXAMINATION: Vital signs: BP 98/64   Pulse (!) 54   Ht 6\' 2"  (1.88 m)   Wt 216 lb (98 kg)   BMI 27.73 kg/m   Constitutional: generally well-appearing, no acute distress Psychiatric: alert and oriented x3, cooperative Eyes: extraocular movements intact, anicteric, conjunctiva pink Mouth: oral pharynx moist, no lesions Neck: supple no lymphadenopathy Cardiovascular: heart regular rate and rhythm, no murmur Lungs: clear to auscultation bilaterally Abdomen: soft, nontender, nondistended, no obvious ascites, no peritoneal signs, normal bowel sounds, no organomegaly Rectal: Omitted Extremities: no clubbing, cyanosis, or lower extremity edema bilaterally Skin: no lesions on visible extremities Neuro: No focal deficits.  Cranial nerves intact  ASSESSMENT:  1.  Longstanding left-sided ulcerative colitis.  In deep remission with Remicade 5 mg/kg every 8 weeks. 2.  Last surveillance colonoscopy October 2018 was normal endoscopically and histologically. 3.  Recent lab abnormalities with mildly elevated transaminases and mild thrombocytopenia.  Resolved on repeat testing.  Continue to monitor 4.  Prophylactic vaccinations.  Detailed discussion.  See below 5.  TB testing through the infusion  center  PLAN:  1.  Continue Remicade 5 mg/kg grams every 8 weeks.  We discussed risks and benefits of discontinuing Remicade therapy and the patient in deep remission. 2.  Recommend Twinrix vaccination series.  He agrees.  The patient will come to this office for this particular  vaccination 3.  Inactivated influenza vaccination through PCP when available.  He agrees 4.  Covid vaccination.  Reviewed risks and benefits.  He understands and agrees.  We will obtain this on his own. 5.  Continue with periodic blood work through the infusion center 6.  Surveillance colonoscopy around October 2023 7.  Routine GI office follow-up in 1 year.  Contact the office in the interim for any questions or problems 30 minutes was spent preparing to see the patient, reviewing outside and inside laboratory test, obtaining comprehensive history, performing comprehensive physical exam, counseling patient regarding his condition and prophylactic vaccinations (and answering multiple appropriate questions to his satisfaction), ordering medications and follow-up, and documenting clinical information in the health record.

## 2019-11-19 ENCOUNTER — Telehealth: Payer: Self-pay

## 2019-11-19 ENCOUNTER — Ambulatory Visit (INDEPENDENT_AMBULATORY_CARE_PROVIDER_SITE_OTHER): Payer: BC Managed Care – PPO | Admitting: Internal Medicine

## 2019-11-19 DIAGNOSIS — Z23 Encounter for immunization: Secondary | ICD-10-CM

## 2019-11-19 NOTE — Telephone Encounter (Signed)
Called pt.  Relayed recommendations.  Patient expressed understanding.

## 2019-11-19 NOTE — Telephone Encounter (Signed)
1.  Not certain.  Would not be common. 2.  He should see his PCP regarding this problem if it persists or worsens 3.  His PCP could refer him to dermatology if needed. Thank you for letting me know. Dr. Marina Goodell

## 2019-11-19 NOTE — Telephone Encounter (Signed)
Patient presented this morning for his 2nd Twinrix injection.  He has developed a round discoloration of the skin above his right outer ankle that is approximately 1 1/2 inch in diameter.  It appears brownish in color and resembles a bruise but the patient indicates he has not had any injury to the area and it is not sore to the touch.  It does appear slightly dry but the pt indicates it is not itchy. There is a second discoloration above and to the right that is much smaller and not as pronounced. The patient indicates these spots have been there about 3 weeks. He is wondering if this could be related to his Remicade. Please advise.

## 2019-11-19 NOTE — Progress Notes (Signed)
Patient received 2nd Twinrix vaccine today. Will be due for 3rd and final April 18, 2020. Tolerated well.

## 2019-12-03 ENCOUNTER — Encounter: Payer: Self-pay | Admitting: Internal Medicine

## 2019-12-03 DIAGNOSIS — K5190 Ulcerative colitis, unspecified, without complications: Secondary | ICD-10-CM | POA: Diagnosis not present

## 2019-12-11 DIAGNOSIS — L810 Postinflammatory hyperpigmentation: Secondary | ICD-10-CM | POA: Diagnosis not present

## 2020-01-19 DIAGNOSIS — J019 Acute sinusitis, unspecified: Secondary | ICD-10-CM | POA: Diagnosis not present

## 2020-02-06 DIAGNOSIS — K5190 Ulcerative colitis, unspecified, without complications: Secondary | ICD-10-CM | POA: Diagnosis not present

## 2020-02-06 DIAGNOSIS — Z111 Encounter for screening for respiratory tuberculosis: Secondary | ICD-10-CM | POA: Diagnosis not present

## 2020-02-06 DIAGNOSIS — R5383 Other fatigue: Secondary | ICD-10-CM | POA: Diagnosis not present

## 2020-02-06 DIAGNOSIS — Z79899 Other long term (current) drug therapy: Secondary | ICD-10-CM | POA: Diagnosis not present

## 2020-04-02 DIAGNOSIS — K5190 Ulcerative colitis, unspecified, without complications: Secondary | ICD-10-CM | POA: Diagnosis not present

## 2020-05-06 ENCOUNTER — Ambulatory Visit: Payer: Self-pay

## 2020-05-06 ENCOUNTER — Ambulatory Visit: Payer: BC Managed Care – PPO | Admitting: Family Medicine

## 2020-05-06 ENCOUNTER — Encounter: Payer: Self-pay | Admitting: Family Medicine

## 2020-05-06 ENCOUNTER — Other Ambulatory Visit: Payer: Self-pay

## 2020-05-06 VITALS — BP 122/90 | HR 68 | Ht 74.0 in

## 2020-05-06 DIAGNOSIS — S86011A Strain of right Achilles tendon, initial encounter: Secondary | ICD-10-CM | POA: Diagnosis not present

## 2020-05-06 DIAGNOSIS — M25571 Pain in right ankle and joints of right foot: Secondary | ICD-10-CM | POA: Diagnosis not present

## 2020-05-06 MED ORDER — TRAMADOL HCL 50 MG PO TABS: 50.0000 mg | ORAL_TABLET | Freq: Three times a day (TID) | ORAL | 0 refills | Status: AC | PRN

## 2020-05-06 NOTE — Progress Notes (Signed)
[name] [address] [place] [address] Phone: [phone] Subjective:   Bruce Donath, am serving as a scribe for Dr. Antoine Primas. This visit occurred during the SARS-CoV-2 public health emergency.  Safety protocols were in place, including screening questions prior to the visit, additional usage of staff PPE, and extensive cleaning of exam room while observing appropriate contact time as indicated for disinfecting solutions.   I'm seeing this patient by the request  of:  Tally Joe, MD  CC: Right ankle pain  PPJ:[id number]  Bobby Medina is a 45 y.o. male coming in with complaint of right achilles injury. Patient states that he was cutting around someone and felt a pop in back of ankle. Pain has improved over night. Antalgic gait. Was wearing ankle brace last night as he has lateral ankle pain.  Patient states that it has been fairly severe overall.  Is able to put weight on it but very very carefully.  Unable to truly push off with that leg.     Past Medical History:  Diagnosis Date  . GERD (gastroesophageal reflux disease)   . Hemorrhage of rectum and anus   . Hemorrhoids   . Hiatal hernia   . Left sided ulcerative (chronic) colitis (HCC)    Past Surgical History:  Procedure Laterality Date  . COLONOSCOPY    . KNEE ARTHROSCOPY    . SHOULDER SURGERY     right   Social History   Socioeconomic History  . Marital status: Married    Spouse name: Not on file  . Number of children: 2  . Years of education: Not on file  . Highest education level: Not on file  Occupational History  . Occupation: Radio producer  . Occupation: SR NETWORK ANALYST    Employer: [employer]  Tobacco Use  . Smoking status: Never Smoker  . Smokeless tobacco: Never Used  Vaping Use  . Vaping Use: Never used  Substance and Sexual Activity  . Alcohol use: No  . Drug use: No  . Sexual activity: Yes    Partners: Female  Other Topics Concern   . Not on file  Social History Narrative  . Not on file   Social Determinants of Health   Financial Resource Strain: Not on file  Food Insecurity: Not on file  Transportation Needs: Not on file  Physical Activity: Not on file  Stress: Not on file  Social Connections: Not on file   No Known Allergies Family History  Problem Relation Age of Onset  . Heart disease Maternal Grandfather   . Colon cancer Neg Hx   . Colon polyps Neg Hx   . Esophageal cancer Neg Hx   . Rectal cancer Neg Hx   . Stomach cancer Neg Hx        Current Outpatient Medications (Analgesics):  .  traMADol (ULTRAM) 50 MG tablet, Take 1 tablet (50 mg total) by mouth every 8 (eight) hours as needed for up to 5 days.   Current Outpatient Medications (Other):  .  acyclovir (ZOVIRAX) 200 MG capsule, as needed. .  inFLIXimab (REMICADE) 100 MG injection, Inject 5 mg/kg into the vein every 8 (eight) weeks.   Reviewed prior external information including notes and imaging from  primary care provider As well as notes that were available from care everywhere and other healthcare systems.  Past medical history, social, surgical and family history all reviewed in electronic medical record.  No pertanent information unless stated  regarding to the chief complaint.   Review of Systems:  No headache, visual changes, nausea, vomiting, diarrhea, constipation, dizziness, abdominal pain, skin rash, fevers, chills, night sweats, weight loss, swollen lymph nodes, body aches, joint swelling, chest pain, shortness of breath, mood changes. POSITIVE muscle aches  Objective  Blood pressure 122/90, pulse 68, height 6\' 2"  (1.88 m), SpO2 94 %.   General: No apparent distress alert and oriented x3 mood and affect normal, dressed appropriately.  HEENT: Pupils equal, extraocular movements intact  Respiratory: Patient's speak in full sentences and does not appear short of breath  Cardiovascular: No lower extremity edema, non tender,  no erythema  Patient has a positive Thompson.  Severely tender in the calf itself.  Patient's Achilles at the insertion seems to have something intact but patient actually does have significant weakness of dorsiflexion noted.  There is a potential gap approximately 2 to 3 cm proximal to the insertion.  Limited musculoskeletal ultrasound was performed and interpreted by  Limited ultrasound shows that patient does have a fairly large likely near full-thickness tear of the Achilles tendon noted.  Difficult to assess secondary to how much inflammation there is if there is any significant retraction at the moment. Impression: Large tear of the Achilles but unable to identify a full-thickness first partial   Impression and Recommendations:     The above documentation has been reviewed and is accurate and complete Judi Saa, DO

## 2020-05-06 NOTE — Patient Instructions (Signed)
Crutches w cam walker MRI R ankle [phone] Medication sent into your pharmacy

## 2020-05-06 NOTE — Assessment & Plan Note (Signed)
Patient does have an Achilles tear.  Positive Thompson test noted.  Concerning at this junction.  Ultrasound today has shown that patient does likely have a complete rupture but very difficult to tell.  At this point I do feel an MRI is necessary to evaluate if surgical intervention is necessary.  At this point patient will be put on crutches as well as wear the cam walker with a heel lift if putting any weight on the area.  Depending on MRI we will discuss the possibility of referral to a surgical intervention or possible PRP and conservative management.

## 2020-05-10 ENCOUNTER — Other Ambulatory Visit: Payer: Self-pay

## 2020-05-10 ENCOUNTER — Ambulatory Visit (INDEPENDENT_AMBULATORY_CARE_PROVIDER_SITE_OTHER): Payer: BC Managed Care – PPO

## 2020-05-10 DIAGNOSIS — M25571 Pain in right ankle and joints of right foot: Secondary | ICD-10-CM

## 2020-05-10 DIAGNOSIS — S86011A Strain of right Achilles tendon, initial encounter: Secondary | ICD-10-CM | POA: Diagnosis not present

## 2020-05-10 DIAGNOSIS — M65871 Other synovitis and tenosynovitis, right ankle and foot: Secondary | ICD-10-CM | POA: Diagnosis not present

## 2020-05-10 DIAGNOSIS — Y9367 Activity, basketball: Secondary | ICD-10-CM | POA: Diagnosis not present

## 2020-05-10 DIAGNOSIS — S86021A Laceration of right Achilles tendon, initial encounter: Secondary | ICD-10-CM | POA: Diagnosis not present

## 2020-05-10 DIAGNOSIS — S99911A Unspecified injury of right ankle, initial encounter: Secondary | ICD-10-CM | POA: Diagnosis not present

## 2020-05-10 DIAGNOSIS — D1779 Benign lipomatous neoplasm of other sites: Secondary | ICD-10-CM | POA: Diagnosis not present

## 2020-05-11 ENCOUNTER — Other Ambulatory Visit: Payer: Self-pay

## 2020-05-11 DIAGNOSIS — M7660 Achilles tendinitis, unspecified leg: Secondary | ICD-10-CM

## 2020-05-18 DIAGNOSIS — M7661 Achilles tendinitis, right leg: Secondary | ICD-10-CM | POA: Diagnosis not present

## 2020-05-20 DIAGNOSIS — G8918 Other acute postprocedural pain: Secondary | ICD-10-CM | POA: Diagnosis not present

## 2020-05-20 DIAGNOSIS — S86091A Other specified injury of right Achilles tendon, initial encounter: Secondary | ICD-10-CM | POA: Diagnosis not present

## 2020-05-20 DIAGNOSIS — S86001A Unspecified injury of right Achilles tendon, initial encounter: Secondary | ICD-10-CM | POA: Diagnosis not present

## 2020-05-20 DIAGNOSIS — Y999 Unspecified external cause status: Secondary | ICD-10-CM | POA: Diagnosis not present

## 2020-06-04 DIAGNOSIS — S86001A Unspecified injury of right Achilles tendon, initial encounter: Secondary | ICD-10-CM | POA: Diagnosis not present

## 2020-06-09 DIAGNOSIS — K5190 Ulcerative colitis, unspecified, without complications: Secondary | ICD-10-CM | POA: Diagnosis not present

## 2020-06-18 ENCOUNTER — Other Ambulatory Visit: Payer: Self-pay

## 2020-06-18 ENCOUNTER — Ambulatory Visit (HOSPITAL_COMMUNITY)
Admission: RE | Admit: 2020-06-18 | Discharge: 2020-06-18 | Disposition: A | Discharge status: Home / Self Care | Payer: BC Managed Care – PPO | Source: Ambulatory Visit | Attending: Orthopedic Surgery | Admitting: Orthopedic Surgery

## 2020-06-18 ENCOUNTER — Other Ambulatory Visit (HOSPITAL_COMMUNITY): Payer: Self-pay | Admitting: Orthopedic Surgery

## 2020-06-18 DIAGNOSIS — M79661 Pain in right lower leg: Secondary | ICD-10-CM

## 2020-06-18 DIAGNOSIS — M7989 Other specified soft tissue disorders: Secondary | ICD-10-CM | POA: Diagnosis not present

## 2020-07-14 DIAGNOSIS — R262 Difficulty in walking, not elsewhere classified: Secondary | ICD-10-CM | POA: Diagnosis not present

## 2020-07-14 DIAGNOSIS — S86001D Unspecified injury of right Achilles tendon, subsequent encounter: Secondary | ICD-10-CM | POA: Diagnosis not present

## 2020-07-14 DIAGNOSIS — Z4889 Encounter for other specified surgical aftercare: Secondary | ICD-10-CM | POA: Diagnosis not present

## 2020-07-16 DIAGNOSIS — R262 Difficulty in walking, not elsewhere classified: Secondary | ICD-10-CM | POA: Diagnosis not present

## 2020-07-16 DIAGNOSIS — Z4889 Encounter for other specified surgical aftercare: Secondary | ICD-10-CM | POA: Diagnosis not present

## 2020-07-16 DIAGNOSIS — S86001D Unspecified injury of right Achilles tendon, subsequent encounter: Secondary | ICD-10-CM | POA: Diagnosis not present

## 2020-07-21 DIAGNOSIS — R262 Difficulty in walking, not elsewhere classified: Secondary | ICD-10-CM | POA: Diagnosis not present

## 2020-07-21 DIAGNOSIS — Z4889 Encounter for other specified surgical aftercare: Secondary | ICD-10-CM | POA: Diagnosis not present

## 2020-07-21 DIAGNOSIS — S86001D Unspecified injury of right Achilles tendon, subsequent encounter: Secondary | ICD-10-CM | POA: Diagnosis not present

## 2020-07-23 DIAGNOSIS — Z4889 Encounter for other specified surgical aftercare: Secondary | ICD-10-CM | POA: Diagnosis not present

## 2020-07-23 DIAGNOSIS — S86001D Unspecified injury of right Achilles tendon, subsequent encounter: Secondary | ICD-10-CM | POA: Diagnosis not present

## 2020-07-23 DIAGNOSIS — R262 Difficulty in walking, not elsewhere classified: Secondary | ICD-10-CM | POA: Diagnosis not present

## 2020-07-31 DIAGNOSIS — S86001D Unspecified injury of right Achilles tendon, subsequent encounter: Secondary | ICD-10-CM | POA: Diagnosis not present

## 2020-07-31 DIAGNOSIS — R262 Difficulty in walking, not elsewhere classified: Secondary | ICD-10-CM | POA: Diagnosis not present

## 2020-07-31 DIAGNOSIS — Z4889 Encounter for other specified surgical aftercare: Secondary | ICD-10-CM | POA: Diagnosis not present

## 2020-08-11 DIAGNOSIS — Z4889 Encounter for other specified surgical aftercare: Secondary | ICD-10-CM | POA: Diagnosis not present

## 2020-08-11 DIAGNOSIS — S86001D Unspecified injury of right Achilles tendon, subsequent encounter: Secondary | ICD-10-CM | POA: Diagnosis not present

## 2020-08-11 DIAGNOSIS — R262 Difficulty in walking, not elsewhere classified: Secondary | ICD-10-CM | POA: Diagnosis not present

## 2020-08-13 DIAGNOSIS — S86001D Unspecified injury of right Achilles tendon, subsequent encounter: Secondary | ICD-10-CM | POA: Diagnosis not present

## 2020-08-13 DIAGNOSIS — Z4889 Encounter for other specified surgical aftercare: Secondary | ICD-10-CM | POA: Diagnosis not present

## 2020-08-13 DIAGNOSIS — R262 Difficulty in walking, not elsewhere classified: Secondary | ICD-10-CM | POA: Diagnosis not present

## 2020-08-14 DIAGNOSIS — K5190 Ulcerative colitis, unspecified, without complications: Secondary | ICD-10-CM | POA: Diagnosis not present

## 2020-08-18 DIAGNOSIS — Z4889 Encounter for other specified surgical aftercare: Secondary | ICD-10-CM | POA: Diagnosis not present

## 2020-08-18 DIAGNOSIS — R262 Difficulty in walking, not elsewhere classified: Secondary | ICD-10-CM | POA: Diagnosis not present

## 2020-08-18 DIAGNOSIS — S86001D Unspecified injury of right Achilles tendon, subsequent encounter: Secondary | ICD-10-CM | POA: Diagnosis not present

## 2020-08-20 DIAGNOSIS — S86001D Unspecified injury of right Achilles tendon, subsequent encounter: Secondary | ICD-10-CM | POA: Diagnosis not present

## 2020-08-20 DIAGNOSIS — R262 Difficulty in walking, not elsewhere classified: Secondary | ICD-10-CM | POA: Diagnosis not present

## 2020-08-20 DIAGNOSIS — Z4889 Encounter for other specified surgical aftercare: Secondary | ICD-10-CM | POA: Diagnosis not present

## 2020-09-01 DIAGNOSIS — Z9889 Other specified postprocedural states: Secondary | ICD-10-CM | POA: Diagnosis not present

## 2020-09-02 DIAGNOSIS — H68103 Unspecified obstruction of Eustachian tube, bilateral: Secondary | ICD-10-CM | POA: Diagnosis not present

## 2020-09-02 DIAGNOSIS — J069 Acute upper respiratory infection, unspecified: Secondary | ICD-10-CM | POA: Diagnosis not present

## 2020-09-10 DIAGNOSIS — R262 Difficulty in walking, not elsewhere classified: Secondary | ICD-10-CM | POA: Diagnosis not present

## 2020-09-10 DIAGNOSIS — S86001D Unspecified injury of right Achilles tendon, subsequent encounter: Secondary | ICD-10-CM | POA: Diagnosis not present

## 2020-09-10 DIAGNOSIS — Z4889 Encounter for other specified surgical aftercare: Secondary | ICD-10-CM | POA: Diagnosis not present

## 2020-10-16 DIAGNOSIS — R5383 Other fatigue: Secondary | ICD-10-CM | POA: Diagnosis not present

## 2020-10-16 DIAGNOSIS — Z111 Encounter for screening for respiratory tuberculosis: Secondary | ICD-10-CM | POA: Diagnosis not present

## 2020-10-16 DIAGNOSIS — K5190 Ulcerative colitis, unspecified, without complications: Secondary | ICD-10-CM | POA: Diagnosis not present

## 2020-12-08 DIAGNOSIS — K5190 Ulcerative colitis, unspecified, without complications: Secondary | ICD-10-CM | POA: Diagnosis not present

## 2020-12-22 DIAGNOSIS — J00 Acute nasopharyngitis [common cold]: Secondary | ICD-10-CM | POA: Diagnosis not present

## 2020-12-28 ENCOUNTER — Telehealth: Payer: Self-pay | Admitting: Internal Medicine

## 2020-12-28 NOTE — Telephone Encounter (Signed)
Patient called states that his insurance is wanting to switch him from Remicade to Avsola. They told him that he would have to call to switch the medication. Please advise.

## 2020-12-30 NOTE — Telephone Encounter (Signed)
Dr. Marina Goodell please see note below and advise. If ok for Avsola I will need to call [facility] to give the order change.

## 2020-12-30 NOTE — Telephone Encounter (Signed)
Orders faxed to [facility] for Avsola 5mg /kg IV to [phone].

## 2020-12-30 NOTE — Telephone Encounter (Signed)
Okay to switch.  Thanks.

## 2021-02-16 DIAGNOSIS — K5190 Ulcerative colitis, unspecified, without complications: Secondary | ICD-10-CM | POA: Diagnosis not present

## 2021-03-20 DIAGNOSIS — J019 Acute sinusitis, unspecified: Secondary | ICD-10-CM | POA: Diagnosis not present

## 2021-05-10 DIAGNOSIS — K5190 Ulcerative colitis, unspecified, without complications: Secondary | ICD-10-CM | POA: Diagnosis not present

## 2021-05-20 DIAGNOSIS — S93492A Sprain of other ligament of left ankle, initial encounter: Secondary | ICD-10-CM | POA: Diagnosis not present

## 2021-06-21 DIAGNOSIS — D485 Neoplasm of uncertain behavior of skin: Secondary | ICD-10-CM | POA: Diagnosis not present

## 2021-06-21 DIAGNOSIS — L820 Inflamed seborrheic keratosis: Secondary | ICD-10-CM | POA: Diagnosis not present

## 2021-07-05 DIAGNOSIS — K5190 Ulcerative colitis, unspecified, without complications: Secondary | ICD-10-CM | POA: Diagnosis not present

## 2021-07-12 DIAGNOSIS — E7800 Pure hypercholesterolemia, unspecified: Secondary | ICD-10-CM | POA: Diagnosis not present

## 2021-07-12 DIAGNOSIS — R5383 Other fatigue: Secondary | ICD-10-CM | POA: Diagnosis not present

## 2021-07-12 DIAGNOSIS — N50819 Testicular pain, unspecified: Secondary | ICD-10-CM | POA: Diagnosis not present

## 2021-07-12 DIAGNOSIS — K5190 Ulcerative colitis, unspecified, without complications: Secondary | ICD-10-CM | POA: Diagnosis not present

## 2021-07-13 ENCOUNTER — Other Ambulatory Visit: Payer: Self-pay | Admitting: Family Medicine

## 2021-07-13 DIAGNOSIS — N50819 Testicular pain, unspecified: Secondary | ICD-10-CM

## 2021-07-20 DIAGNOSIS — R946 Abnormal results of thyroid function studies: Secondary | ICD-10-CM | POA: Diagnosis not present

## 2021-08-09 ENCOUNTER — Ambulatory Visit
Admission: RE | Admit: 2021-08-09 | Discharge: 2021-08-09 | Disposition: A | Discharge status: Home / Self Care | Payer: BC Managed Care – PPO | Source: Ambulatory Visit | Attending: Family Medicine | Admitting: Family Medicine

## 2021-08-09 DIAGNOSIS — N50819 Testicular pain, unspecified: Secondary | ICD-10-CM

## 2021-08-09 DIAGNOSIS — N503 Cyst of epididymis: Secondary | ICD-10-CM | POA: Diagnosis not present

## 2021-08-30 DIAGNOSIS — K5190 Ulcerative colitis, unspecified, without complications: Secondary | ICD-10-CM | POA: Diagnosis not present

## 2021-09-24 ENCOUNTER — Encounter: Payer: Self-pay | Admitting: Internal Medicine

## 2021-09-24 ENCOUNTER — Ambulatory Visit: Payer: BC Managed Care – PPO | Admitting: Internal Medicine

## 2021-09-24 VITALS — BP 128/72 | HR 68 | Ht 74.0 in | Wt 214.5 lb

## 2021-09-24 DIAGNOSIS — K5150 Left sided colitis without complications: Secondary | ICD-10-CM

## 2021-09-24 DIAGNOSIS — R5382 Chronic fatigue, unspecified: Secondary | ICD-10-CM | POA: Diagnosis not present

## 2021-09-24 DIAGNOSIS — Z23 Encounter for immunization: Secondary | ICD-10-CM

## 2021-09-24 NOTE — Progress Notes (Signed)
HISTORY OF PRESENT ILLNESS:  Bobby Medina is a 46 y.o. male, previous patient of Dr. Sheryn Bison, with longstanding left-sided ulcerative colitis (diagnosed 16 years ago) who established with myself May 2015.  At that time he was on Remicade 5 mg/kg every 8 weeks.  He was placed on this regimen after his ulcerative colitis had been proven to be refractory to multiple other agents as previously outlined.  He was last seen in this office October 11, 2019.  See that dictation for details.  At that time he was felt to be in deep remission on medical therapy.  Since that time he is continue to receive infusion therapy at a local infusion facility.  They monitor his blood work and provide TB testing.  His last surveillance colonoscopy was October 2018.  At that time the colon and ileum were grossly and microscopically normal.  Follow-up in 5 years recommended.  He presents today at the recommendation of his PCP for GI evaluation regarding complaints of fatigue.  Patient tells me that he has noticed issues with fatigue and exercise intolerance as well as exercise or exertional related shortness of breath.  Symptoms began in February of this year.  He denies chest pain, lower extremity edema, or cough.  He tells me that he saw his primary provider and underwent a physical examination as well as blood work.  Apparently, has had some abnormalities in thyroid testing for which she is due to see an endocrinologist in the next few weeks.  From a GI perspective, he has done well.  His GI review of systems is entirely negative.  He was changed from the standard Remicade formulation to biosimilar earlier this year.  There was a question as to whether this was somehow related.  He has not noticed any particular issues while he receives his infusion therapies.  Blood work from the infusion therapy July 05, 2021 was unremarkable.  Patient inquires about completion of his hepatitis a and B vaccination series.  He completed  2-3 sessions.    REVIEW OF SYSTEMS:  All non-GI ROS negative unless otherwise stated in the HPI except for fatigue, night sweats, sleeping problems  Past Medical History:  Diagnosis Date   GERD (gastroesophageal reflux disease)    Hemorrhage of rectum and anus    Hemorrhoids    Hiatal hernia    Left sided ulcerative (chronic) colitis (HCC)     Past Surgical History:  Procedure Laterality Date   ACHILLES TENDON REPAIR Right    COLONOSCOPY     KNEE ARTHROSCOPY Right    SHOULDER SURGERY Right     Social History Bobby Medina  reports that he has never smoked. He has never used smokeless tobacco. He reports that he does not drink alcohol and does not use drugs.  family history includes Heart disease in his maternal grandfather; Other in his father.  No Known Allergies     PHYSICAL EXAMINATION: Vital signs: BP 128/72 (BP Location: Left Arm, Patient Position: Sitting, Cuff Size: Normal)   Pulse 68   Ht 6\' 2"  (1.88 m)   Wt 214 lb 8 oz (97.3 kg)   BMI 27.54 kg/m   Constitutional: generally well-appearing, no acute distress Psychiatric: alert and oriented x3, cooperative Eyes: extraocular movements intact, anicteric, conjunctiva pink Mouth: oral pharynx moist, no lesions Neck: supple no lymphadenopathy Cardiovascular: heart regular rate and rhythm, no murmur Lungs: clear to auscultation bilaterally Abdomen: soft, nontender, nondistended, no obvious ascites, no peritoneal signs, normal bowel sounds, no  organomegaly Rectal: Omitted Extremities: no clubbing, cyanosis, or lower extremity edema bilaterally Skin: no lesions on visible extremities Neuro: No focal deficits.  Cranial nerves intact  ASSESSMENT:  1.  Complaints of fatigue, exercise intolerance, and exertional related shortness of breath.  No evidence for GI primary cause or cause related to GI medication. 2.  Thyroid testing abnormality.  Due to see endocrinologist.  Possibly explain symptom complex 3.  I  recommended cardiology evaluation.  He is not against this idea, though would like to wait until his endocrinology assessment has been completed. 4.  History of left-sided ulcerative colitis requiring Remicade to come under control 5.  Last colonoscopy 2018.  Will be due for surveillance within the next year   PLAN:  1.  Continue anti-TNF for a history of refractory left-sided colitis 2.  Continue blood work monitoring with infusion center. 3.  Complete hepatitis A and B vaccination series 4.  Surveillance colonoscopy within the next year 5.  Office follow-up 1 year.  Interval follow-up as needed A total time of 30 minutes was spent preparing to see the patient, reviewing outside records, reviewing laboratory data, obtaining comprehensive history, performing medically appropriate physical examination, counseling and educating the patient regarding the above listed issues, ordering vaccination, and documenting clinical information in the health record

## 2021-09-24 NOTE — Patient Instructions (Signed)
_______________________________________________________  If you are age 44 or older, your body mass index should be between 23-30. Your Body mass index is 27.54 kg/m. If this is out of the aforementioned range listed, please consider follow up with your Primary Care Provider.  If you are age 67 or younger, your body mass index should be between 19-25. Your Body mass index is 27.54 kg/m. If this is out of the aformentioned range listed, please consider follow up with your Primary Care Provider.   ________________________________________________________  The [facility] providers would like to encourage you to use [facility] to communicate with providers for non-urgent requests or questions.  Due to long hold times on the telephone, sending your provider a message by [facility] may be a faster and more efficient way to get a response.  Please allow 48 business hours for a response.  Please remember that this is for non-urgent requests.  _______________________________________________________

## 2021-10-01 DIAGNOSIS — E0590 Thyrotoxicosis, unspecified without thyrotoxic crisis or storm: Secondary | ICD-10-CM | POA: Diagnosis not present

## 2021-10-13 ENCOUNTER — Other Ambulatory Visit: Payer: Self-pay | Admitting: Endocrinology

## 2021-10-13 ENCOUNTER — Other Ambulatory Visit (HOSPITAL_COMMUNITY): Payer: Self-pay | Admitting: Endocrinology

## 2021-10-13 DIAGNOSIS — E0590 Thyrotoxicosis, unspecified without thyrotoxic crisis or storm: Secondary | ICD-10-CM

## 2021-10-26 ENCOUNTER — Ambulatory Visit (INDEPENDENT_AMBULATORY_CARE_PROVIDER_SITE_OTHER): Payer: BC Managed Care – PPO | Admitting: Internal Medicine

## 2021-10-26 ENCOUNTER — Ambulatory Visit: Payer: BC Managed Care – PPO

## 2021-10-26 DIAGNOSIS — G4700 Insomnia, unspecified: Secondary | ICD-10-CM | POA: Diagnosis not present

## 2021-10-26 DIAGNOSIS — Z23 Encounter for immunization: Secondary | ICD-10-CM | POA: Diagnosis not present

## 2021-10-26 DIAGNOSIS — K5150 Left sided colitis without complications: Secondary | ICD-10-CM

## 2021-10-26 DIAGNOSIS — K5190 Ulcerative colitis, unspecified, without complications: Secondary | ICD-10-CM | POA: Diagnosis not present

## 2021-10-26 DIAGNOSIS — E0590 Thyrotoxicosis, unspecified without thyrotoxic crisis or storm: Secondary | ICD-10-CM | POA: Diagnosis not present

## 2021-11-01 ENCOUNTER — Encounter (HOSPITAL_COMMUNITY)
Admission: RE | Admit: 2021-11-01 | Discharge: 2021-11-01 | Disposition: A | Discharge status: Home / Self Care | Payer: BC Managed Care – PPO | Source: Ambulatory Visit | Attending: Endocrinology | Admitting: Endocrinology

## 2021-11-01 DIAGNOSIS — E0590 Thyrotoxicosis, unspecified without thyrotoxic crisis or storm: Secondary | ICD-10-CM | POA: Diagnosis not present

## 2021-11-01 MED ORDER — SODIUM IODIDE I-123 7.4 MBQ CAPS
405.0000 | ORAL_CAPSULE | Freq: Once | ORAL | Status: AC
  Administered 2021-11-01: 405 via ORAL

## 2021-11-02 ENCOUNTER — Encounter (HOSPITAL_COMMUNITY)
Admission: RE | Admit: 2021-11-02 | Discharge: 2021-11-02 | Disposition: A | Discharge status: Home / Self Care | Payer: BC Managed Care – PPO | Source: Ambulatory Visit | Attending: Endocrinology | Admitting: Endocrinology

## 2021-11-02 DIAGNOSIS — E0590 Thyrotoxicosis, unspecified without thyrotoxic crisis or storm: Secondary | ICD-10-CM | POA: Diagnosis not present

## 2021-11-03 DIAGNOSIS — Z79899 Other long term (current) drug therapy: Secondary | ICD-10-CM | POA: Diagnosis not present

## 2021-11-03 DIAGNOSIS — R5383 Other fatigue: Secondary | ICD-10-CM | POA: Diagnosis not present

## 2021-11-03 DIAGNOSIS — K5190 Ulcerative colitis, unspecified, without complications: Secondary | ICD-10-CM | POA: Diagnosis not present

## 2021-11-03 DIAGNOSIS — Z111 Encounter for screening for respiratory tuberculosis: Secondary | ICD-10-CM | POA: Diagnosis not present

## 2021-11-16 ENCOUNTER — Other Ambulatory Visit (HOSPITAL_COMMUNITY): Payer: Self-pay | Admitting: Endocrinology

## 2021-11-16 DIAGNOSIS — E0590 Thyrotoxicosis, unspecified without thyrotoxic crisis or storm: Secondary | ICD-10-CM

## 2021-11-16 NOTE — Written Directive (Cosign Needed)
   MOLECULAR IMAGING AND THERAPEUTICS WRITTEN DIRECTIVE   PATIENT NAME: Bobby Medina  PT DOB:   [birth year]                                              MRN: [id number]  ---------------------------------------------------------------------------------------------------------------------   I-131 WHOLE THYROID THERAPY (NON-CANCER)    RADIOPHARMACEUTICAL:   Iodine-131 Capsule    PRESCRIBED DOSE FOR ADMINISTRATION: 18 mCi   ROUTE OFADMINISTRATION: PO   DIAGNOSIS: Hyperthyroidism   REFERRING PHYSICIAN: Dr. Chalmers Cater   TSH:  As of 10/02/2021   0.005 Lab Results  Component Value Date   TSH 1.52 07/08/2009   TSH 1.63 10/30/2007     PRIOR I-131 THERAPY (Date and Dose):   PRIOR RADIOLOGY EXAMS (Results and Date): NM THYROID MULT UPTAKE W/IMAGING  Result Date: 11/02/2021 CLINICAL DATA:  Hyperthyroidism, E 05.90 EXAM: THYROID SCAN AND UPTAKE - 4 AND 24 HOURS TECHNIQUE: Following oral administration of I-123 capsule, anterior planar imaging was acquired at 24 hours. Thyroid uptake was calculated with a thyroid probe at 4-6 hours and 24 hours. RADIOPHARMACEUTICALS:  405 uCi I-123 sodium iodide p.o. COMPARISON:  None Available. FINDINGS: Homogeneous tracer distribution in both thyroid lobes. No focal areas of increased or decreased tracer localization. 4 hour I-123 uptake = 21.2% (normal 5-20%) 24 hour I-123 uptake = 40.2% (normal 10-30%) IMPRESSION: Normal thyroid scan with elevated 4 hour and 24 hour radio iodine uptakes. Findings are consistent with Graves disease. Electronically Signed   By: Lavonia Dana M.D.   On: 11/02/2021 15:10      ADDITIONAL PHYSICIAN COMMENTS/NOTES Graves disease by imaging and clinical data --   AUTHORIZED USER SIGNATURE & TIME STAMP: .wr

## 2021-11-23 ENCOUNTER — Encounter: Payer: Self-pay | Admitting: Internal Medicine

## 2021-11-29 ENCOUNTER — Encounter (HOSPITAL_COMMUNITY)
Admission: RE | Admit: 2021-11-29 | Discharge: 2021-11-29 | Disposition: A | Discharge status: Home / Self Care | Payer: BC Managed Care – PPO | Source: Ambulatory Visit | Attending: Endocrinology | Admitting: Endocrinology

## 2021-11-29 DIAGNOSIS — E0590 Thyrotoxicosis, unspecified without thyrotoxic crisis or storm: Secondary | ICD-10-CM | POA: Insufficient documentation

## 2021-11-29 DIAGNOSIS — E0500 Thyrotoxicosis with diffuse goiter without thyrotoxic crisis or storm: Secondary | ICD-10-CM | POA: Diagnosis not present

## 2021-11-29 MED ORDER — SODIUM IODIDE I 131 CAPSULE
17.4000 | Freq: Once | INTRAVENOUS | Status: AC
  Administered 2021-11-29: 17.4 via ORAL

## 2021-12-06 DIAGNOSIS — E0590 Thyrotoxicosis, unspecified without thyrotoxic crisis or storm: Secondary | ICD-10-CM | POA: Diagnosis not present

## 2021-12-06 DIAGNOSIS — Z23 Encounter for immunization: Secondary | ICD-10-CM | POA: Diagnosis not present

## 2021-12-06 DIAGNOSIS — E0500 Thyrotoxicosis with diffuse goiter without thyrotoxic crisis or storm: Secondary | ICD-10-CM | POA: Diagnosis not present

## 2021-12-29 DIAGNOSIS — K5190 Ulcerative colitis, unspecified, without complications: Secondary | ICD-10-CM | POA: Diagnosis not present

## 2021-12-29 DIAGNOSIS — Z79899 Other long term (current) drug therapy: Secondary | ICD-10-CM | POA: Diagnosis not present

## 2022-02-07 DIAGNOSIS — E0500 Thyrotoxicosis with diffuse goiter without thyrotoxic crisis or storm: Secondary | ICD-10-CM | POA: Diagnosis not present

## 2022-02-10 DIAGNOSIS — E7800 Pure hypercholesterolemia, unspecified: Secondary | ICD-10-CM | POA: Diagnosis not present

## 2022-02-10 DIAGNOSIS — Z0000 Encounter for general adult medical examination without abnormal findings: Secondary | ICD-10-CM | POA: Diagnosis not present

## 2022-03-07 ENCOUNTER — Other Ambulatory Visit: Payer: Self-pay

## 2022-03-07 ENCOUNTER — Encounter: Payer: Self-pay | Admitting: Internal Medicine

## 2022-03-07 ENCOUNTER — Telehealth: Payer: Self-pay | Admitting: Internal Medicine

## 2022-03-07 NOTE — Telephone Encounter (Signed)
PT is calling to get a referral for his Remicade. He was referred to [facility] rheumatology by [place] and they have denied him. Please advise

## 2022-03-07 NOTE — Telephone Encounter (Signed)
Pt called and he can no longer get the Avsola at [facility]. Would you like him referred to [facility] infusion or [facility]? Please advise.

## 2022-03-07 NOTE — Telephone Encounter (Signed)
Pt was getting Avsola at [facility] and he can no longer get it there. He needs the Avsola every 8 weeks. He is already a few weeks late with his infusion. Please let me know if I need to do anything further.

## 2022-03-07 NOTE — Telephone Encounter (Signed)
Not yet familiar with either.  There is no significant difference, then [place] infusion center.  Thanks

## 2022-03-08 ENCOUNTER — Other Ambulatory Visit: Payer: Self-pay | Admitting: Pharmacy Technician

## 2022-03-08 ENCOUNTER — Encounter: Payer: Self-pay | Admitting: Internal Medicine

## 2022-03-08 NOTE — Telephone Encounter (Addendum)
Auth Submission: APPROVED - Submitted as urgent Payer: BCBS Medication & CPT/J Code(s) submitted: Avsola (infliximab-axxq) [phone] Route of submission (phone, fax, portal): CMM Phone # Fax # Auth type: Buy/Bill Units/visits requested: 500MG Q8WKS  Reference number: [id number] Approval from:03/08/22  to  03/07/23

## 2022-03-08 NOTE — Telephone Encounter (Signed)
Pt knows we are working to get insurance PA for [employer] and that he should get a call to set up infusion date and time from the infusion center.

## 2022-03-09 DIAGNOSIS — J0100 Acute maxillary sinusitis, unspecified: Secondary | ICD-10-CM | POA: Diagnosis not present

## 2022-03-10 ENCOUNTER — Telehealth: Payer: Self-pay | Admitting: Pharmacy Technician

## 2022-03-10 NOTE — Telephone Encounter (Addendum)
Roena Malady Submission: APPROVED Payer: BCBS Medication & CPT/J Code(s) submitted: Avsola (infliximab-axxq) [phone] Route of submission (phone, fax, portal): CMM Phone # Fax # Auth type: Buy/Bill Units/visits requested: 7 DOSES = 350 UNITS Reference number: [id number] Approval from: 03/08/22 to 03/07/23   Patient will be scheduled as soon as possible  Avsola co-pay card: Approved Id: [id number]

## 2022-03-10 NOTE — Telephone Encounter (Signed)
Thanks so much. 

## 2022-03-29 ENCOUNTER — Ambulatory Visit (INDEPENDENT_AMBULATORY_CARE_PROVIDER_SITE_OTHER): Payer: BC Managed Care – PPO

## 2022-03-29 VITALS — BP 123/75 | HR 51 | Temp 97.9°F | Resp 16 | Ht 75.0 in | Wt 227.8 lb

## 2022-03-29 DIAGNOSIS — K5150 Left sided colitis without complications: Secondary | ICD-10-CM | POA: Diagnosis not present

## 2022-03-29 MED ORDER — SODIUM CHLORIDE 0.9 % IV SOLN
5.0000 mg/kg | Freq: Once | INTRAVENOUS | Status: AC
  Administered 2022-03-29: 500 mg via INTRAVENOUS
  Filled 2022-03-29: qty 50

## 2022-03-29 MED ORDER — METHYLPREDNISOLONE SODIUM SUCC 40 MG IJ SOLR: 40.0000 mg | Freq: Once | INTRAMUSCULAR | Status: DC

## 2022-03-29 MED ORDER — DIPHENHYDRAMINE HCL 25 MG PO CAPS: 25.0000 mg | ORAL_CAPSULE | Freq: Once | ORAL | Status: DC

## 2022-03-29 MED ORDER — ACETAMINOPHEN 325 MG PO TABS: 650.0000 mg | ORAL_TABLET | Freq: Once | ORAL | Status: DC

## 2022-03-29 NOTE — Progress Notes (Signed)
Diagnosis: Ulcerative Colitis  Provider:  Marshell Garfinkel MD  Procedure: Infusion  IV Type: Peripheral, IV Location: R Hand  Avsola (infliximab-axxq), Dose: 500 mg  Infusion Start Time: 0908  Infusion Stop Time: 1125  Post Infusion IV Care: Patient declined observation and Peripheral IV Discontinued  Discharge: Condition: Good, Destination: Home . AVS Declined  Performed by:  Adelina Mings, LPN

## 2022-04-06 DIAGNOSIS — E0500 Thyrotoxicosis with diffuse goiter without thyrotoxic crisis or storm: Secondary | ICD-10-CM | POA: Diagnosis not present

## 2022-04-13 DIAGNOSIS — E890 Postprocedural hypothyroidism: Secondary | ICD-10-CM | POA: Diagnosis not present

## 2022-04-26 ENCOUNTER — Ambulatory Visit (AMBULATORY_SURGERY_CENTER): Payer: BC Managed Care – PPO | Admitting: *Deleted

## 2022-04-26 VITALS — Ht 74.0 in | Wt 228.0 lb

## 2022-04-26 DIAGNOSIS — Z86010 Personal history of colonic polyps: Secondary | ICD-10-CM

## 2022-04-26 DIAGNOSIS — K51919 Ulcerative colitis, unspecified with unspecified complications: Secondary | ICD-10-CM

## 2022-04-26 MED ORDER — NA SULFATE-K SULFATE-MG SULF 17.5-3.13-1.6 GM/177ML PO SOLN: 1.0000 | Freq: Once | ORAL | 0 refills | Status: AC

## 2022-04-26 NOTE — Progress Notes (Signed)
Pt's previsit is done over the phone and all paperwork (prep instructions) sent to patient. Pt's name and DOB verified at the beginning of the previsit. Pt denies any difficulty with ambulating.  No egg or soy allergy known to patient  No issues known to pt with past sedation with any surgeries or procedures Patient denies ever being intubated No FH of Malignant Hyperthermia Pt is not on diet pills Pt is not on  home 02  Pt is not on blood thinners  Pt denies issues with constipation  Pt is not on dialysis Pt denies any upcoming cardiac testing Pt encouraged to use to use Singlecare or Goodrx to reduce cost  Patient's chart reviewed by Osvaldo Angst CNRA prior to previsit and patient appropriate for the [place].  Previsit completed and red dot placed by patient's name on their procedure day (on provider's schedule).  . Visit by phone Pt states her weight is 228 lb Instructions reviewed with pt and pt states understanding. Instructed to review again prior to procedure. Pt states they will.  Instructions sent by mail with coupon and by my chart

## 2022-05-09 ENCOUNTER — Encounter: Payer: Self-pay | Admitting: Internal Medicine

## 2022-05-12 DIAGNOSIS — E7800 Pure hypercholesterolemia, unspecified: Secondary | ICD-10-CM | POA: Diagnosis not present

## 2022-05-17 ENCOUNTER — Encounter: Payer: Self-pay | Admitting: Internal Medicine

## 2022-05-24 ENCOUNTER — Telehealth: Payer: Self-pay | Admitting: Internal Medicine

## 2022-05-24 ENCOUNTER — Ambulatory Visit (INDEPENDENT_AMBULATORY_CARE_PROVIDER_SITE_OTHER): Payer: BC Managed Care – PPO

## 2022-05-24 VITALS — BP 113/73 | HR 51 | Temp 97.5°F | Resp 18 | Ht 75.0 in | Wt 229.8 lb

## 2022-05-24 DIAGNOSIS — K5150 Left sided colitis without complications: Secondary | ICD-10-CM

## 2022-05-24 MED ORDER — METHYLPREDNISOLONE SODIUM SUCC 40 MG IJ SOLR
40.0000 mg | Freq: Once | INTRAMUSCULAR | Status: DC
  Filled 2022-05-24: qty 1

## 2022-05-24 MED ORDER — DIPHENHYDRAMINE HCL 25 MG PO CAPS
25.0000 mg | ORAL_CAPSULE | Freq: Once | ORAL | Status: AC
  Administered 2022-05-24: 25 mg via ORAL
  Filled 2022-05-24: qty 1

## 2022-05-24 MED ORDER — ACETAMINOPHEN 325 MG PO TABS
650.0000 mg | ORAL_TABLET | Freq: Once | ORAL | Status: DC
  Filled 2022-05-24: qty 2

## 2022-05-24 MED ORDER — SODIUM CHLORIDE 0.9 % IV SOLN
5.0000 mg/kg | Freq: Once | INTRAVENOUS | Status: AC
  Administered 2022-05-24: 500 mg via INTRAVENOUS
  Filled 2022-05-24: qty 50

## 2022-05-24 NOTE — Telephone Encounter (Signed)
LM with # for call back

## 2022-05-24 NOTE — Telephone Encounter (Signed)
Patient's wife is calling states patient didn't know that the diet was 5 days so he has been eating things he was not supposed to, looking for advise on what to do. Please advise

## 2022-05-24 NOTE — Telephone Encounter (Signed)
Called pt to help with questions about prep education. LM with # for pt to call.

## 2022-05-24 NOTE — Telephone Encounter (Signed)
Attempted to reach pt to clarify questions he has about procedure instructions

## 2022-05-24 NOTE — Progress Notes (Signed)
Diagnosis: Crohn's Disease  Provider:  Chilton Greathouse MD  Procedure: IV Infusion  IV Type: Peripheral, IV Location: L Antecubital  Avsola (infliximab-axxq), Dose: 500 mg  Infusion Start Time: 0934  Infusion Stop Time: 1145  Post Infusion IV Care: Peripheral IV Discontinued  Discharge: Condition: Good, Destination: Home . AVS Provided  Performed by:  Marlow Baars [place], RN

## 2022-05-25 ENCOUNTER — Telehealth: Payer: Self-pay | Admitting: *Deleted

## 2022-05-25 DIAGNOSIS — E890 Postprocedural hypothyroidism: Secondary | ICD-10-CM | POA: Diagnosis not present

## 2022-05-25 MED ORDER — NA SULFATE-K SULFATE-MG SULF 17.5-3.13-1.6 GM/177ML PO SOLN: 1.0000 | Freq: Once | ORAL | 0 refills | Status: DC

## 2022-05-25 MED ORDER — NA SULFATE-K SULFATE-MG SULF 17.5-3.13-1.6 GM/177ML PO SOLN: 1.0000 | Freq: Once | ORAL | 0 refills | Status: AC

## 2022-05-25 NOTE — Telephone Encounter (Signed)
Call to pt and medication showing sent to pharmacy. Hung up with pt and called pharmacy and reordered med while on the phone with pharmacy and they stated it may take up to 30 min to receive, Pt is due to start prep today a 6pm. Recalled pharmacy after 30 min and was to do Verbal order for med if did not have. Pharmacist stated they did not have it in stock at their store. Reordered the med to the [facility] on [address] that pharmacist stated had it in stock. Called the   [facility] and they stated they received the med order and were filling it. Called pt and infomned him of location of prep and to go pick it up. Pt states he will.

## 2022-05-25 NOTE — Telephone Encounter (Signed)
Spoke with pt and discussed him having eaten some granola type snacks on Mon and Tues this week. Instructed pt to hydrate every hour 10 oz while awake from the clear liquid of his choice list today and tomorrow until time instructed not to drink fluids. Reordered Suprep to the [facility] in pts chart. Pt stated he understood the instructions and will hydrate well.

## 2022-05-25 NOTE — Telephone Encounter (Signed)
Patient is returning phone call. Requesting a call back to discuss questions he had regarding procedure. Please advise. Thank you.

## 2022-05-25 NOTE — Telephone Encounter (Signed)
Spoke with pt and discussed him having eaten some granola type snacks on Mon and Tues this week. Instructed pt to hydrate every hour 10 oz while awake from the clear liquid of his choice list today and tomorrow until time instructed not to drink fluids. Reordered Suprep to the [facility] in pts chart. Pt stated he understood the instructions and will hydrate well. 

## 2022-05-25 NOTE — Telephone Encounter (Signed)
Call to pt and medication showing sent to pharmacy. Hung up with pt and called pharmacy and reordered med while on the phone with pharmacy and they stated it may take up to 30 min to receive, Pt is due to start prep today a 6pm. Recalled pharmacy after 30 min and was to do Verbal order for med if did not have. Pharmacist stated they did not have it in stock at their store. Reordered the med to the [facility] on [address] that pharmacist stated had it in stock. Called the   [facility] [address] [facility] and they stated they received the med order and were filling it. Called pt and infomned him of location of prep and to go pick it up. Pt states he will. 

## 2022-05-25 NOTE — Telephone Encounter (Signed)
PT is calling about Suprep RX. [facility] expressed they do not have anything from [place]. Please advise.

## 2022-05-26 ENCOUNTER — Encounter: Payer: Self-pay | Admitting: Internal Medicine

## 2022-05-26 ENCOUNTER — Ambulatory Visit (AMBULATORY_SURGERY_CENTER): Payer: BC Managed Care – PPO | Admitting: Internal Medicine

## 2022-05-26 VITALS — BP 100/70 | HR 52 | Temp 98.4°F | Resp 9 | Ht 74.0 in | Wt 228.0 lb

## 2022-05-26 DIAGNOSIS — K51919 Ulcerative colitis, unspecified with unspecified complications: Secondary | ICD-10-CM

## 2022-05-26 DIAGNOSIS — K635 Polyp of colon: Secondary | ICD-10-CM | POA: Diagnosis not present

## 2022-05-26 DIAGNOSIS — Z1211 Encounter for screening for malignant neoplasm of colon: Secondary | ICD-10-CM

## 2022-05-26 DIAGNOSIS — D120 Benign neoplasm of cecum: Secondary | ICD-10-CM

## 2022-05-26 MED ORDER — SODIUM CHLORIDE 0.9 % IV SOLN: 500.0000 mL | INTRAVENOUS | Status: DC

## 2022-05-26 NOTE — Progress Notes (Signed)
Sedate, gd SR, tolerated procedure well, VSS, report to RN 

## 2022-05-26 NOTE — Op Note (Signed)
[facility] Patient Name: Bobby Medina Procedure Date: 05/26/2022 9:24 AM MRN: [id number] Endoscopist: Wilhemina Bonito. Marina Goodell , MD, [id number] Age: 47 Referring MD:  Date of Birth: 31-May-1975 Gender: Male Account #: [id number] Procedure:                Colonoscopy with cold snare polypectomy x 1;                            surveillance biopsies Indications:              High risk colon cancer surveillance: Ulcerative                            left sided colitis of 8 (or more) years duration Medicines:                Monitored Anesthesia Care Procedure:                Pre-Anesthesia Assessment:                           - Prior to the procedure, a History and Physical                            was performed, and patient medications and                            allergies were reviewed. The patient's tolerance of                            previous anesthesia was also reviewed. The risks                            and benefits of the procedure and the sedation                            options and risks were discussed with the patient.                            All questions were answered, and informed consent                            was obtained. Prior Anticoagulants: The patient has                            taken no anticoagulant or antiplatelet agents. ASA                            Grade Assessment: II - A patient with mild systemic                            disease. After reviewing the risks and benefits,                            the patient was deemed in satisfactory condition to  undergo the procedure.                           After obtaining informed consent, the colonoscope                            was passed under direct vision. Throughout the                            procedure, the patient's blood pressure, pulse, and                            oxygen saturations were monitored continuously. The                            Olympus  Scope SN [id number] was introduced through the                            anus and advanced to the the cecum, identified by                            appendiceal orifice and ileocecal valve. The                            ileocecal valve, appendiceal orifice, and rectum                            were photographed. The quality of the bowel                            preparation was excellent. The colonoscopy was                            performed without difficulty. The patient tolerated                            the procedure well. The bowel preparation used was                            SUPREP via split dose instruction. Scope In: 9:29:12 AM Scope Out: 9:45:07 AM Scope Withdrawal Time: 0 hours 12 minutes 57 seconds  Total Procedure Duration: 0 hours 15 minutes 55 seconds  Findings:                 A 3 mm polyp was found in the cecum. The polyp was                            sessile. The polyp was removed with a cold snare.                            Resection and retrieval were complete.                           The exam was otherwise without abnormality on  direct and retroflexion views.                           Biopsies were taken with a cold forceps in the                            rectum, in the sigmoid colon and in the descending                            colon for histology. Complications:            No immediate complications. Estimated blood loss:                            None. Estimated Blood Loss:     Estimated blood loss: none. Impression:               - One 3 mm polyp in the cecum, removed with a cold                            snare. Resected and retrieved.                           - The examination was otherwise normal on direct                            and retroflexion views.                           - Biopsies were taken with a cold forceps for                            histology in the rectum, in the sigmoid colon and                             in the descending colon. Recommendation:           - Repeat colonoscopy in 5 years for surveillance.                           - Patient has a contact number available for                            emergencies. The signs and symptoms of potential                            delayed complications were discussed with the                            patient. Return to normal activities tomorrow.                            Written discharge instructions were provided to the  patient.                           - Resume previous diet.                           - Continue present medications.                           - Await pathology results. Wilhemina Bonito. Marina Goodell, MD 05/26/2022 9:50:58 AM This report has been signed electronically.

## 2022-05-26 NOTE — Progress Notes (Signed)
Called to room to assist during endoscopic procedure.  Patient ID and intended procedure confirmed with present staff. Received instructions for my participation in the procedure from the performing physician.  

## 2022-05-26 NOTE — Progress Notes (Signed)
Patient reports no changes to health or medications since pre visit. 

## 2022-05-26 NOTE — Progress Notes (Signed)
HISTORY OF PRESENT ILLNESS:  Bobby Medina is a 47 y.o. 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  On Remicade therapy.  In remission.  Not for surveillance colonoscopy.  Last examination 2018  REVIEW OF SYSTEMS:  All non-GI ROS negative except for  Past Medical History:  Diagnosis Date   GERD (gastroesophageal reflux disease)    Hemorrhage of rectum and anus    Hemorrhoids    Hyperthyroidism    Left sided ulcerative (chronic) colitis    Thyroid disease     Past Surgical History:  Procedure Laterality Date   ACHILLES TENDON REPAIR Right    COLONOSCOPY     KNEE ARTHROSCOPY Right    SHOULDER SURGERY Right     Social History Bobby Medina  reports that he has never smoked. He has never used smokeless tobacco. He reports that he does not drink alcohol and does not use drugs.  family history includes Heart disease in his maternal grandfather; Other in his father.  No Known Allergies     PHYSICAL EXAMINATION: Vital signs: BP 107/71   Pulse (!) 58   Temp 98.4 F (36.9 C)   Ht  (1.88 m)   Wt 228 lb (103.4 kg)   SpO2 98%   BMI 29.27 kg/m  General: Well-developed, well-nourished, no acute distress HEENT: Sclerae are anicteric, conjunctiva pink. Oral mucosa intact Lungs: Clear Heart: Regular Abdomen: soft, nontender, nondistended, no obvious ascites, no peritoneal signs, normal bowel sounds. No organomegaly. Extremities: No edema Psychiatric: alert and oriented x3. Cooperative     ASSESSMENT:   Longstanding left-sided UC in clinical remission  PLAN:   Surveillance colonoscopy

## 2022-05-26 NOTE — Patient Instructions (Signed)
YOU HAD AN ENDOSCOPIC PROCEDURE TODAY AT THE [facility]:   Refer to the procedure report that was given to you for any specific questions about what was found during the examination.  If the procedure report does not answer your questions, please call your gastroenterologist to clarify.  If you requested that your care partner not be given the details of your procedure findings, then the procedure report has been included in a sealed envelope for you to review at your convenience later.  YOU SHOULD EXPECT: Some feelings of bloating in the abdomen. Passage of more gas than usual.  Walking can help get rid of the air that was put into your GI tract during the procedure and reduce the bloating. If you had a lower endoscopy (such as a colonoscopy or flexible sigmoidoscopy) you may notice spotting of blood in your stool or on the toilet paper. If you underwent a bowel prep for your procedure, you may not have a normal bowel movement for a few days.  Please Note:  You might notice some irritation and congestion in your nose or some drainage.  This is from the oxygen used during your procedure.  There is no need for concern and it should clear up in a day or so.  SYMPTOMS TO REPORT IMMEDIATELY:  Following lower endoscopy (colonoscopy or flexible sigmoidoscopy):  Excessive amounts of blood in the stool  Significant tenderness or worsening of abdominal pains  Swelling of the abdomen that is new, acute  Fever of 100F or higher  For urgent or emergent issues, a gastroenterologist can be reached at any hour by calling [phone]. Do not use MyChart messaging for urgent concerns.    DIET:  We do recommend a small meal at first, but then you may proceed to your regular diet.  Drink plenty of fluids but you should avoid alcoholic beverages for 24 hours.  ACTIVITY:  You should plan to take it easy for the rest of today and you should NOT DRIVE or use heavy machinery until tomorrow (because of  the sedation medicines used during the test).    FOLLOW UP: Our staff will call the number listed on your records the next business day following your procedure.  We will call around 7:15- 8:00 am to check on you and address any questions or concerns that you may have regarding the information given to you following your procedure. If we do not reach you, we will leave a message.     If any biopsies were taken you will be contacted by phone or by letter within the next 1-3 weeks.  Please call us at [phone] if you have not heard about the biopsies in 3 weeks.    SIGNATURES/CONFIDENTIALITY: You and/or your care partner have signed paperwork which will be entered into your electronic medical record.  These signatures attest to the fact that that the information above on your After Visit Summary has been reviewed and is understood.  Full responsibility of the confidentiality of this discharge information lies with you and/or your care-partner.  

## 2022-05-27 ENCOUNTER — Telehealth: Payer: Self-pay | Admitting: *Deleted

## 2022-05-27 NOTE — Telephone Encounter (Signed)
Post procedure follow up phone call. No answer at number given.  Left message on voicemail.  

## 2022-06-13 ENCOUNTER — Encounter: Payer: Self-pay | Admitting: Internal Medicine

## 2022-07-14 DIAGNOSIS — E890 Postprocedural hypothyroidism: Secondary | ICD-10-CM | POA: Diagnosis not present

## 2022-07-19 ENCOUNTER — Ambulatory Visit (INDEPENDENT_AMBULATORY_CARE_PROVIDER_SITE_OTHER): Payer: BC Managed Care – PPO

## 2022-07-19 VITALS — BP 116/72 | HR 49 | Temp 98.2°F | Resp 18 | Ht 74.0 in | Wt 217.0 lb

## 2022-07-19 DIAGNOSIS — K5150 Left sided colitis without complications: Secondary | ICD-10-CM | POA: Diagnosis not present

## 2022-07-19 MED ORDER — ACETAMINOPHEN 325 MG PO TABS: 650.0000 mg | ORAL_TABLET | Freq: Once | ORAL | Status: DC

## 2022-07-19 MED ORDER — SODIUM CHLORIDE 0.9 % IV SOLN
5.0000 mg/kg | Freq: Once | INTRAVENOUS | Status: AC
  Administered 2022-07-19: 500 mg via INTRAVENOUS
  Filled 2022-07-19: qty 50

## 2022-07-19 MED ORDER — METHYLPREDNISOLONE SODIUM SUCC 40 MG IJ SOLR: 40.0000 mg | Freq: Once | INTRAMUSCULAR | Status: DC

## 2022-07-19 MED ORDER — DIPHENHYDRAMINE HCL 25 MG PO CAPS
25.0000 mg | ORAL_CAPSULE | Freq: Once | ORAL | Status: AC
  Administered 2022-07-19: 25 mg via ORAL
  Filled 2022-07-19: qty 1

## 2022-07-19 NOTE — Progress Notes (Signed)
Diagnosis: Crohn's Disease  Provider:  Chilton Greathouse MD  Procedure: IV Infusion  IV Type: Peripheral, IV Location: R Hand  Avsola (infliximab-axxq), Dose: 500 mg  Infusion Start Time: 0943  Infusion Stop Time: 1200  Post Infusion IV Care: Peripheral IV Discontinued  Discharge: Condition: Good, Destination: Home . AVS Declined  Performed by:  Loney Hering, LPN

## 2022-09-13 ENCOUNTER — Ambulatory Visit (INDEPENDENT_AMBULATORY_CARE_PROVIDER_SITE_OTHER): Payer: BC Managed Care – PPO

## 2022-09-13 VITALS — BP 119/80 | HR 54 | Temp 98.0°F | Resp 18 | Ht 74.0 in | Wt 220.0 lb

## 2022-09-13 DIAGNOSIS — K5150 Left sided colitis without complications: Secondary | ICD-10-CM

## 2022-09-13 MED ORDER — SODIUM CHLORIDE 0.9 % IV SOLN
5.0000 mg/kg | Freq: Once | INTRAVENOUS | Status: AC
  Administered 2022-09-13: 500 mg via INTRAVENOUS
  Filled 2022-09-13: qty 50

## 2022-09-13 MED ORDER — DIPHENHYDRAMINE HCL 25 MG PO CAPS: 25.0000 mg | ORAL_CAPSULE | Freq: Once | ORAL | Status: DC

## 2022-09-13 MED ORDER — ACETAMINOPHEN 325 MG PO TABS: 650.0000 mg | ORAL_TABLET | Freq: Once | ORAL | Status: DC

## 2022-09-13 MED ORDER — METHYLPREDNISOLONE SODIUM SUCC 40 MG IJ SOLR: 40.0000 mg | Freq: Once | INTRAMUSCULAR | Status: DC

## 2022-09-13 NOTE — Progress Notes (Signed)
Diagnosis: Ulcerative Colitis  Provider:  Chilton Greathouse MD  Procedure: IV Infusion  IV Type: Peripheral, IV Location: L Forearm  Avsola (infliximab-axxq), Dose: 500 mg  Infusion Start Time: 0933  Infusion Stop Time: 1144  Post Infusion IV Care: Patient declined observation. PIV removed.  Discharge: Condition: Good, Destination: Home . AVS Declined  Performed by:  Rico Ala, LPN

## 2022-11-08 ENCOUNTER — Ambulatory Visit: Payer: BC Managed Care – PPO

## 2022-11-08 VITALS — BP 120/82 | HR 59 | Temp 97.9°F | Resp 16 | Ht 75.0 in | Wt 227.2 lb

## 2022-11-08 DIAGNOSIS — K5150 Left sided colitis without complications: Secondary | ICD-10-CM | POA: Diagnosis not present

## 2022-11-08 MED ORDER — SODIUM CHLORIDE 0.9 % IV SOLN
5.0000 mg/kg | Freq: Once | INTRAVENOUS | Status: AC
  Administered 2022-11-08: 500 mg via INTRAVENOUS
  Filled 2022-11-08: qty 50

## 2022-11-08 MED ORDER — DIPHENHYDRAMINE HCL 25 MG PO CAPS: 25.0000 mg | ORAL_CAPSULE | Freq: Once | ORAL | Status: DC

## 2022-11-08 MED ORDER — ACETAMINOPHEN 325 MG PO TABS: 650.0000 mg | ORAL_TABLET | Freq: Once | ORAL | Status: DC

## 2022-11-08 MED ORDER — METHYLPREDNISOLONE SODIUM SUCC 40 MG IJ SOLR: 40.0000 mg | Freq: Once | INTRAMUSCULAR | Status: DC

## 2022-11-08 NOTE — Progress Notes (Signed)
Diagnosis: Ulcerative Colitis  Provider:  Chilton Greathouse MD  Procedure: IV Infusion  IV Type: Peripheral, IV Location: L Forearm  Avsola (infliximab-axxq), Dose: 500 mg  Infusion Start Time: 0853  Infusion Stop Time: 1100  Post Infusion IV Care: Peripheral IV Discontinued  Discharge: Condition: Good, Destination: Home . AVS Declined  Performed by:  Nat Math, RN

## 2023-01-03 ENCOUNTER — Ambulatory Visit: Payer: BC Managed Care – PPO

## 2023-01-17 ENCOUNTER — Ambulatory Visit: Payer: BC Managed Care – PPO

## 2023-01-17 VITALS — BP 116/79 | HR 57 | Temp 98.0°F | Resp 18 | Ht 75.0 in | Wt 227.2 lb

## 2023-01-17 DIAGNOSIS — K5150 Left sided colitis without complications: Secondary | ICD-10-CM

## 2023-01-17 MED ORDER — ACETAMINOPHEN 325 MG PO TABS
650.0000 mg | ORAL_TABLET | Freq: Once | ORAL | Status: DC
Start: 2023-01-17 — End: 2023-01-17

## 2023-01-17 MED ORDER — METHYLPREDNISOLONE SODIUM SUCC 40 MG IJ SOLR
40.0000 mg | Freq: Once | INTRAMUSCULAR | Status: DC
Start: 2023-01-17 — End: 2023-01-17

## 2023-01-17 MED ORDER — DIPHENHYDRAMINE HCL 25 MG PO CAPS: 25.0000 mg | ORAL_CAPSULE | Freq: Once | ORAL | Status: DC

## 2023-01-17 MED ORDER — SODIUM CHLORIDE 0.9 % IV SOLN
5.0000 mg/kg | Freq: Once | INTRAVENOUS | Status: AC
  Administered 2023-01-17: 500 mg via INTRAVENOUS
  Filled 2023-01-17: qty 50

## 2023-01-17 NOTE — Progress Notes (Signed)
Diagnosis: Ulcerative Colitis  Provider:  Chilton Greathouse MD  Procedure: IV Infusion  IV Type: Peripheral, IV Location: L Antecubital  Avsola (infliximab-axxq), Dose: 500 mg  Patient refused pre-medications. Nurse educated patient and stressed the importance of taking pre-medications as a precaution in the event of a medication reaction. Patient verbalized understanding.   Infusion Start Time: 0937  Infusion Stop Time: 1145  Post Infusion IV Care: Patient declined observation and Peripheral IV Discontinued  Discharge: Condition: Stable, Destination: Home . AVS Declined  Performed by:  Wyvonne Lenz, RN

## 2023-01-26 DIAGNOSIS — J018 Other acute sinusitis: Secondary | ICD-10-CM | POA: Diagnosis not present

## 2023-01-31 DIAGNOSIS — J22 Unspecified acute lower respiratory infection: Secondary | ICD-10-CM | POA: Diagnosis not present

## 2023-02-06 ENCOUNTER — Other Ambulatory Visit: Payer: Self-pay

## 2023-02-07 ENCOUNTER — Telehealth: Payer: Self-pay | Admitting: Pharmacy Technician

## 2023-02-07 IMAGING — MR MR ANKLE*R* W/O CM
5 series · 40 of 40 positions shown · non-contrast
Comparison: None.

CLINICAL DATA: Right ankle pain after basketball injury. Concern
for Achilles tendon injury.

EXAM:
MRI OF THE RIGHT ANKLE WITHOUT CONTRAST
TECHNIQUE: Multiplanar, multisequence MR imaging of the ankle was performed. No
intravenous contrast was administered.

[Series 5: PD · axial · 3.0mm · 0.70mm/px · z∈[-135,+15]mm · 9 of 47 slices shown]
[im 1/47]
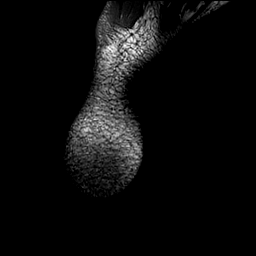
[im 6/47]
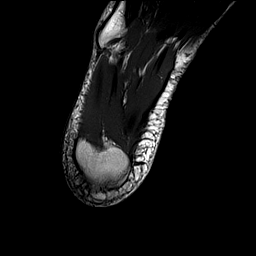
[im 12/47]
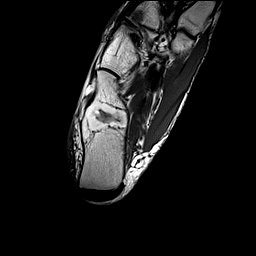
[im 18/47]
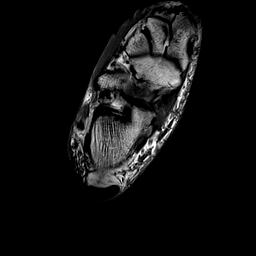
[im 24/47]
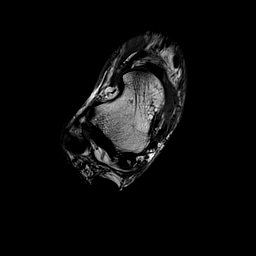
[im 29/47]
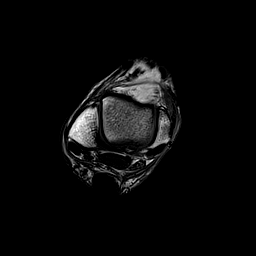
[im 35/47]
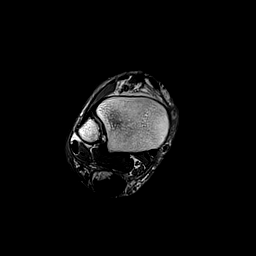
[im 41/47]
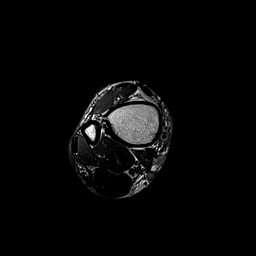
[im 47/47]
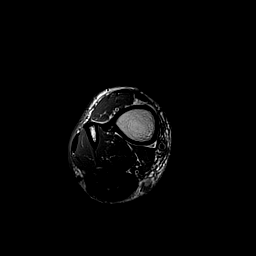

[Series 6: T2 fat-sat · axial · 3.0mm · 0.70mm/px · z∈[-135,+15]mm · 10 of 47 slices shown]
[im 1/47]
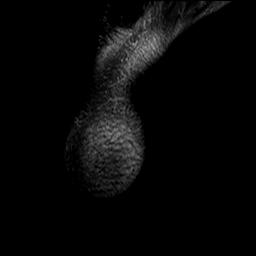
[im 6/47]
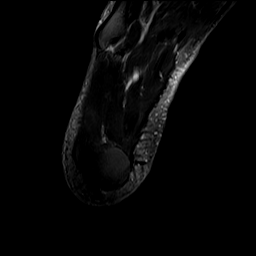
[im 11/47]
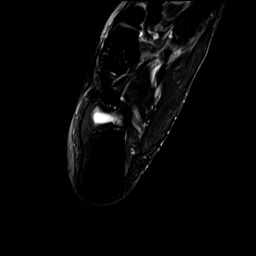
[im 16/47]
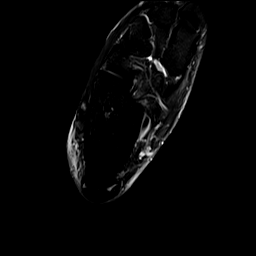
[im 21/47]
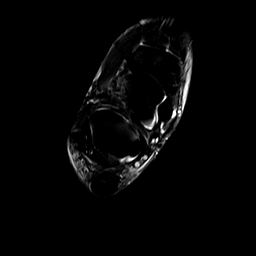
[im 26/47]
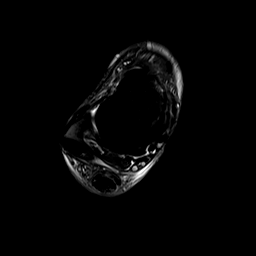
[im 31/47]
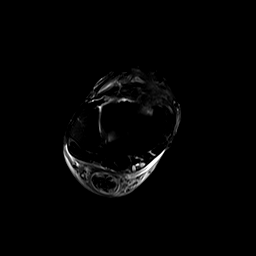
[im 36/47]
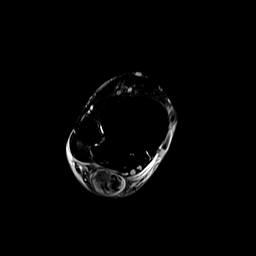
[im 41/47]
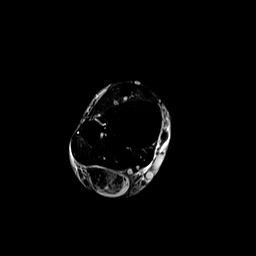
[im 47/47]
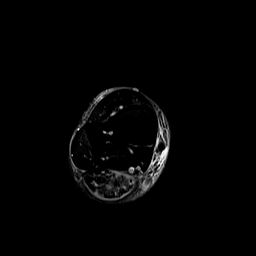

[Series 7: STIR · coronal · 3.0mm · 0.70mm/px · 9 of 45 slices shown (1 of 2)]
[im 1/45]
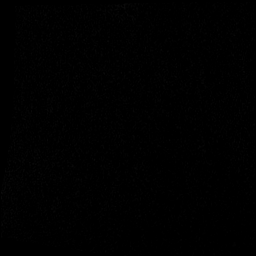
[im 6/45]
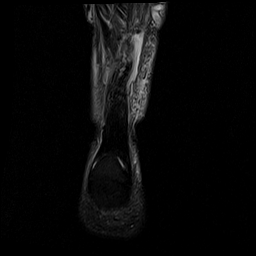
[im 12/45]
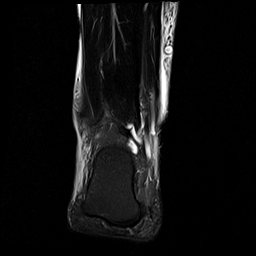
[im 17/45]
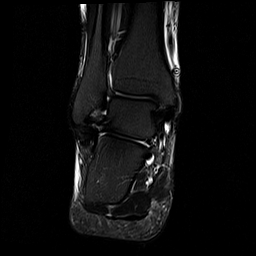
[im 23/45]
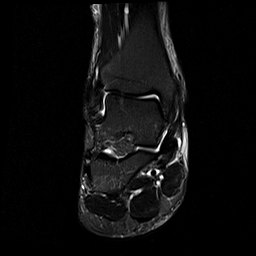
[im 28/45]
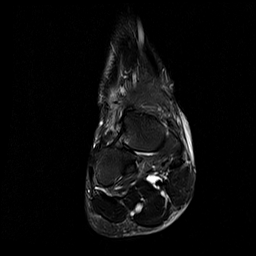
[im 34/45]
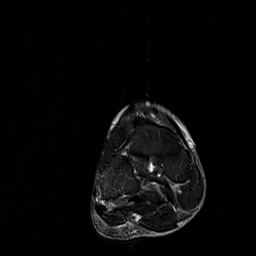
[im 39/45]
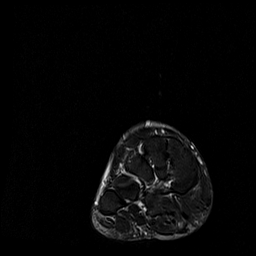
[im 45/45]
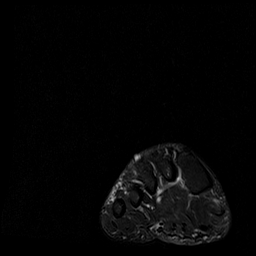

[Series 8: T1 · sagittal · 3.0mm · 0.56mm/px · 6 of 29 slices shown]
[im 1/29]
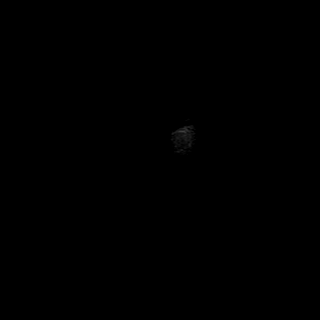
[im 6/29]
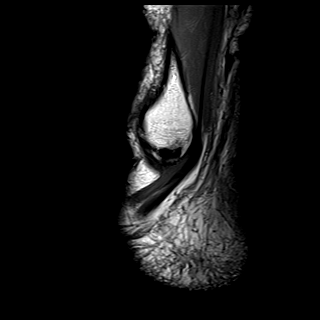
[im 12/29]
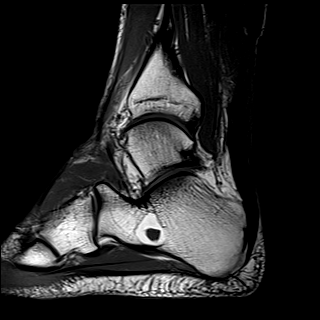
[im 17/29]
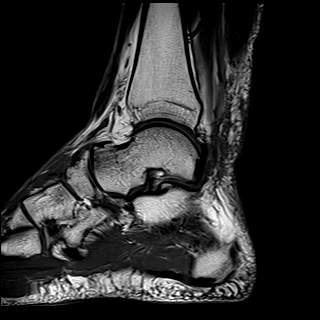
[im 23/29]
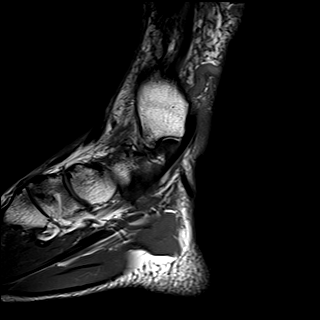
[im 29/29]
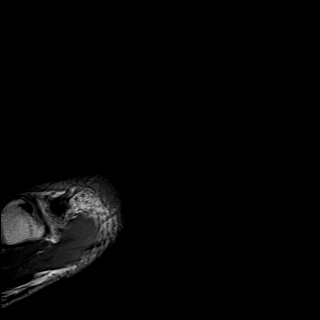

[Series 9: STIR · sagittal · 3.0mm · 0.70mm/px · 6 of 29 slices shown (2 of 2)]
[im 1/29]
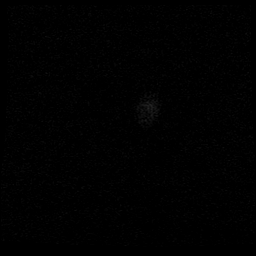
[im 6/29]
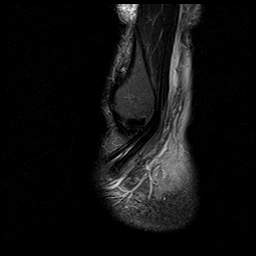
[im 12/29]
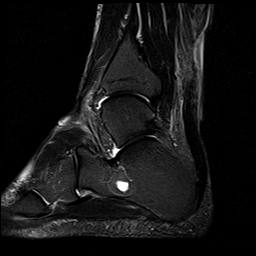
[im 17/29]
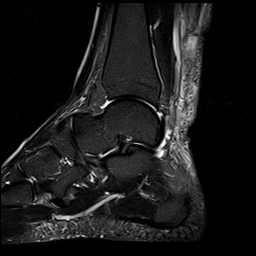
[im 23/29]
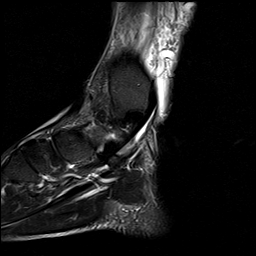
[im 29/29]
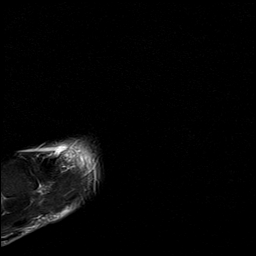

[40 of 40 positions shown; findings below may reference images not displayed]

FINDINGS: TENDONS

Peroneal: Peroneal longus tendon intact. Peroneal brevis intact.

Posteromedial: Posterior tibial tendon intact with small amount of
fluid in the tendon sheath. Flexor digitorum longus tendon intact
with small amount of fluid in the tendon sheath. Flexor hallucis
longus tendon intact with small amount of fluid in the tendon
sheath.

Anterior: Tibialis anterior tendon intact. Extensor hallucis longus
tendon intact Extensor digitorum longus tendon intact.

Achilles: Essentially complete tear of the proximal Achilles tendon
with tendon gap of 2.7 cm. There is moderate thickening of the mid
and distal Achilles tendon.

Plantar Fascia: Intact.

LIGAMENTS

Lateral: Anterior talofibular ligament intact. Calcaneofibular
ligament intact. Posterior talofibular ligament intact. Anterior and
posterior tibiofibular ligaments intact.

Medial: Deltoid ligament intact. Spring ligament intact.

CARTILAGE

Ankle Joint: No joint effusion. Normal ankle mortise. No chondral
defect.

Subtalar Joints/Sinus Tarsi: Normal subtalar joints. No subtalar
joint effusion. Normal sinus tarsi.

Bones: 2.4 x 2.9 x 1.7 cm intraosseous lipoma in the calcaneus. No
fracture or dislocation.

Soft Tissue: Posterior ankle soft tissue swelling. No soft tissue
mass or fluid collection.
IMPRESSION: 1. Essentially complete tear of the proximal Achilles tendon with
tendon gap of 2.7 cm. Moderate tendinosis of the intact Achilles
tendon.
2. Mild flexor tenosynovitis.
3. Calcaneal intraosseous lipoma.

## 2023-02-07 NOTE — Telephone Encounter (Signed)
 Auth Submission: APPROVED Site of care: Site of care: [facility] Payer: BCBS Medication & CPT/J Code(s) submitted: Avsola  (infliximab -axxq) [id number] Route of submission (phone, fax, portal):  Phone # Fax # Auth type: Buy/Bill PB Units/visits requested: 5MG /KG - 500 mg q8wks Reference number: [id number] Approval from: 02/06/23 to 02/06/24

## 2023-02-13 ENCOUNTER — Other Ambulatory Visit: Payer: Self-pay | Admitting: Nurse Practitioner

## 2023-02-13 DIAGNOSIS — E7800 Pure hypercholesterolemia, unspecified: Secondary | ICD-10-CM | POA: Diagnosis not present

## 2023-02-13 DIAGNOSIS — Z0000 Encounter for general adult medical examination without abnormal findings: Secondary | ICD-10-CM | POA: Diagnosis not present

## 2023-02-13 DIAGNOSIS — Z23 Encounter for immunization: Secondary | ICD-10-CM | POA: Diagnosis not present

## 2023-02-13 DIAGNOSIS — Z8639 Personal history of other endocrine, nutritional and metabolic disease: Secondary | ICD-10-CM | POA: Diagnosis not present

## 2023-03-08 DIAGNOSIS — B349 Viral infection, unspecified: Secondary | ICD-10-CM | POA: Diagnosis not present

## 2023-03-14 ENCOUNTER — Ambulatory Visit: Payer: BC Managed Care – PPO

## 2023-03-14 VITALS — BP 116/80 | HR 56 | Temp 97.5°F | Resp 18 | Ht 74.0 in | Wt 223.0 lb

## 2023-03-14 DIAGNOSIS — K5150 Left sided colitis without complications: Secondary | ICD-10-CM

## 2023-03-14 MED ORDER — METHYLPREDNISOLONE SODIUM SUCC 40 MG IJ SOLR
40.0000 mg | Freq: Once | INTRAMUSCULAR | Status: DC
Start: 2023-03-14 — End: 2023-03-14

## 2023-03-14 MED ORDER — ACETAMINOPHEN 325 MG PO TABS: 650.0000 mg | ORAL_TABLET | Freq: Once | ORAL | Status: DC

## 2023-03-14 MED ORDER — DIPHENHYDRAMINE HCL 25 MG PO CAPS: 25.0000 mg | ORAL_CAPSULE | Freq: Once | ORAL | Status: DC

## 2023-03-14 MED ORDER — SODIUM CHLORIDE 0.9 % IV SOLN
5.0000 mg/kg | Freq: Once | INTRAVENOUS | Status: AC
  Administered 2023-03-14: 500 mg via INTRAVENOUS
  Filled 2023-03-14: qty 50

## 2023-03-14 NOTE — Progress Notes (Signed)
Diagnosis: Ulcerative Colitis  Provider:  Chilton Greathouse MD  Procedure: IV Infusion  IV Type: Peripheral, IV Location: R Antecubital  Avsola (infliximab-axxq), Dose: 500 mg  Infusion Start Time: 0935  Infusion Stop Time: 1146  Post Infusion IV Care: Peripheral IV Discontinued  Discharge: Condition: Good, Destination: Home . AVS Declined  Performed by:  Rico Ala, LPN

## 2023-04-13 DIAGNOSIS — E890 Postprocedural hypothyroidism: Secondary | ICD-10-CM | POA: Diagnosis not present

## 2023-05-09 ENCOUNTER — Ambulatory Visit (INDEPENDENT_AMBULATORY_CARE_PROVIDER_SITE_OTHER): Payer: BC Managed Care – PPO

## 2023-05-09 VITALS — BP 120/79 | HR 50 | Temp 97.8°F | Resp 18 | Ht 74.0 in | Wt 229.8 lb

## 2023-05-09 DIAGNOSIS — K5150 Left sided colitis without complications: Secondary | ICD-10-CM | POA: Diagnosis not present

## 2023-05-09 MED ORDER — ACETAMINOPHEN 325 MG PO TABS: 650.0000 mg | ORAL_TABLET | Freq: Once | ORAL | Status: DC

## 2023-05-09 MED ORDER — DIPHENHYDRAMINE HCL 25 MG PO CAPS: 25.0000 mg | ORAL_CAPSULE | Freq: Once | ORAL | Status: DC

## 2023-05-09 MED ORDER — METHYLPREDNISOLONE SODIUM SUCC 40 MG IJ SOLR: 40.0000 mg | Freq: Once | INTRAMUSCULAR | Status: DC

## 2023-05-09 MED ORDER — SODIUM CHLORIDE 0.9 % IV SOLN
5.0000 mg/kg | Freq: Once | INTRAVENOUS | Status: AC
  Administered 2023-05-09: 500 mg via INTRAVENOUS
  Filled 2023-05-09: qty 50

## 2023-05-09 NOTE — Progress Notes (Signed)
 Diagnosis: Ulcerative Colitis  Provider:  Chilton Greathouse MD  Procedure: IV Infusion  IV Type: Peripheral, IV Location: L Forearm  Avsola (infliximab-axxq), Dose: 500mg   Infusion Start Time: 1001  Infusion Stop Time: 1215  Post Infusion IV Care: Peripheral IV Discontinued  Discharge: Condition: Good, Destination: Home . AVS Declined  Performed by:  Adriana Mccallum, RN    Patient refused pre-medications. Nurse educated patient and stressed the importance of taking pre-medications as a precaution in the event of a medication reaction. Patient verbalized understanding.

## 2023-07-04 ENCOUNTER — Ambulatory Visit (INDEPENDENT_AMBULATORY_CARE_PROVIDER_SITE_OTHER)

## 2023-07-04 VITALS — BP 126/69 | HR 52 | Temp 97.5°F | Resp 16 | Ht 75.0 in | Wt 227.0 lb

## 2023-07-04 DIAGNOSIS — K5150 Left sided colitis without complications: Secondary | ICD-10-CM

## 2023-07-04 MED ORDER — DIPHENHYDRAMINE HCL 25 MG PO CAPS: 25.0000 mg | ORAL_CAPSULE | Freq: Once | ORAL | Status: DC

## 2023-07-04 MED ORDER — METHYLPREDNISOLONE SODIUM SUCC 40 MG IJ SOLR
40.0000 mg | Freq: Once | INTRAMUSCULAR | Status: DC
Start: 2023-07-04 — End: 2023-07-04

## 2023-07-04 MED ORDER — SODIUM CHLORIDE 0.9 % IV SOLN
5.0000 mg/kg | Freq: Once | INTRAVENOUS | Status: AC
  Administered 2023-07-04: 500 mg via INTRAVENOUS
  Filled 2023-07-04: qty 50

## 2023-07-04 MED ORDER — ACETAMINOPHEN 325 MG PO TABS: 650.0000 mg | ORAL_TABLET | Freq: Once | ORAL | Status: DC

## 2023-07-04 NOTE — Progress Notes (Signed)
 Diagnosis: Ulcerative Colitis  Provider:  Phyllis Breeze MD  Procedure: IV Infusion  IV Type: Peripheral, IV Location: L forearm   Avsola  (infliximab -axxq), Dose: 500 mg  Infusion Start Time: 0915  Infusion Stop Time: 1125  Post Infusion IV Care: Peripheral IV Discontinued  Discharge: Condition: Good, Destination: Home . AVS Declined  Performed by:  Star East, LPN

## 2023-09-05 ENCOUNTER — Ambulatory Visit

## 2023-09-05 VITALS — BP 120/75 | HR 48 | Temp 97.7°F | Resp 16 | Ht 75.0 in | Wt 228.4 lb

## 2023-09-05 DIAGNOSIS — K5150 Left sided colitis without complications: Secondary | ICD-10-CM

## 2023-09-05 MED ORDER — ACETAMINOPHEN 325 MG PO TABS: 650.0000 mg | ORAL_TABLET | Freq: Once | ORAL | Status: DC

## 2023-09-05 MED ORDER — DIPHENHYDRAMINE HCL 25 MG PO CAPS: 25.0000 mg | ORAL_CAPSULE | Freq: Once | ORAL | Status: DC

## 2023-09-05 MED ORDER — SODIUM CHLORIDE 0.9 % IV SOLN
5.0000 mg/kg | Freq: Once | INTRAVENOUS | Status: AC
  Administered 2023-09-05: 500 mg via INTRAVENOUS
  Filled 2023-09-05: qty 50

## 2023-09-05 MED ORDER — METHYLPREDNISOLONE SODIUM SUCC 40 MG IJ SOLR: 40.0000 mg | Freq: Once | INTRAMUSCULAR | Status: DC

## 2023-09-05 NOTE — Progress Notes (Signed)
 Diagnosis: Ulcerative Colitis  Provider:  Lonna Coder MD  Procedure: IV Infusion  IV Type: Peripheral, IV Location: R Hand  Avsola  (infliximab -axxq), Dose: 500 mg  Infusion Start Time: 0843  Infusion Stop Time: 1055  Post Infusion IV Care: Peripheral IV Discontinued  Discharge: Condition: Good, Destination: Home . AVS Declined  Performed by:  Leita FORBES Miles, LPN

## 2023-10-31 ENCOUNTER — Ambulatory Visit (INDEPENDENT_AMBULATORY_CARE_PROVIDER_SITE_OTHER)

## 2023-10-31 VITALS — BP 135/86 | HR 55 | Temp 98.1°F | Resp 16 | Ht 75.0 in | Wt 229.4 lb

## 2023-10-31 DIAGNOSIS — K5150 Left sided colitis without complications: Secondary | ICD-10-CM

## 2023-10-31 MED ORDER — ACETAMINOPHEN 325 MG PO TABS: 650.0000 mg | ORAL_TABLET | Freq: Once | ORAL | Status: DC

## 2023-10-31 MED ORDER — SODIUM CHLORIDE 0.9 % IV SOLN
5.0000 mg/kg | Freq: Once | INTRAVENOUS | Status: AC
  Administered 2023-10-31: 500 mg via INTRAVENOUS
  Filled 2023-10-31: qty 50

## 2023-10-31 MED ORDER — METHYLPREDNISOLONE SODIUM SUCC 40 MG IJ SOLR: 40.0000 mg | Freq: Once | INTRAMUSCULAR | Status: DC

## 2023-10-31 MED ORDER — DIPHENHYDRAMINE HCL 25 MG PO CAPS: 25.0000 mg | ORAL_CAPSULE | Freq: Once | ORAL | Status: DC

## 2023-10-31 NOTE — Progress Notes (Signed)
 Diagnosis: Ulcerative Colitis  Provider:  Lonna Coder MD  Procedure: IV Infusion  IV Type: Peripheral, IV Location: L Hand  Avsola  (infliximab -axxq), Dose: 500 mg  Infusion Start Time: 0859  Infusion Stop Time: 1111  Post Infusion IV Care: Peripheral IV Discontinued  Discharge: Condition: Good, Destination: Home . AVS Declined  Performed by:  Leita FORBES Miles, LPN

## 2023-12-26 ENCOUNTER — Ambulatory Visit (INDEPENDENT_AMBULATORY_CARE_PROVIDER_SITE_OTHER)

## 2023-12-26 VITALS — BP 123/77 | HR 59 | Temp 97.8°F | Resp 16 | Ht 75.0 in | Wt 233.4 lb

## 2023-12-26 DIAGNOSIS — K5150 Left sided colitis without complications: Secondary | ICD-10-CM

## 2023-12-26 MED ORDER — DIPHENHYDRAMINE HCL 25 MG PO CAPS: 25.0000 mg | ORAL_CAPSULE | Freq: Once | ORAL | Status: DC

## 2023-12-26 MED ORDER — METHYLPREDNISOLONE SODIUM SUCC 40 MG IJ SOLR: 40.0000 mg | Freq: Once | INTRAMUSCULAR | Status: DC

## 2023-12-26 MED ORDER — SODIUM CHLORIDE 0.9 % IV SOLN
5.0000 mg/kg | Freq: Once | INTRAVENOUS | Status: AC
  Administered 2023-12-26: 500 mg via INTRAVENOUS
  Filled 2023-12-26: qty 50

## 2023-12-26 MED ORDER — ACETAMINOPHEN 325 MG PO TABS: 650.0000 mg | ORAL_TABLET | Freq: Once | ORAL | Status: DC

## 2023-12-26 NOTE — Progress Notes (Signed)
 Diagnosis: Ulcerative Colitis  Provider:  Lonna Coder MD  Procedure: IV Infusion  IV Type: Peripheral, IV Location: R Hand  Avsola  (infliximab -axxq), Dose: 500 mg  Infusion Start Time: 0839  Infusion Stop Time: 1050  Post Infusion IV Care: Peripheral IV Discontinued  Discharge: Condition: Good, Destination: Home . AVS Declined  Performed by:  Barack Nicodemus, RN

## 2024-01-09 ENCOUNTER — Telehealth: Payer: Self-pay | Admitting: Pharmacy Technician

## 2024-01-09 NOTE — Telephone Encounter (Signed)
 Auth Submission: PENDING latent Site of care: Site of care: [facility] Payer: BCBS Medication & CPT/J Code(s) submitted: Avsola  (infliximab -axxq) [id number] Diagnosis Code:  Route of submission (phone, fax, portal): LATENT Phone # Fax # Auth type: Buy/Bill PB Units/visits requested: 5MG /KG Q8WKS 500MG  Reference number:  Approval from:  to

## 2024-01-11 ENCOUNTER — Encounter: Payer: Self-pay | Admitting: Internal Medicine

## 2024-02-20 ENCOUNTER — Ambulatory Visit

## 2024-02-20 VITALS — BP 117/77 | HR 58 | Temp 97.5°F | Resp 18 | Ht 75.0 in | Wt 228.4 lb

## 2024-02-20 DIAGNOSIS — K5150 Left sided colitis without complications: Secondary | ICD-10-CM

## 2024-02-20 MED ORDER — METHYLPREDNISOLONE SODIUM SUCC 40 MG IJ SOLR: 40.0000 mg | Freq: Once | INTRAMUSCULAR | Status: DC

## 2024-02-20 MED ORDER — DIPHENHYDRAMINE HCL 25 MG PO CAPS: 25.0000 mg | ORAL_CAPSULE | Freq: Once | ORAL | Status: DC

## 2024-02-20 MED ORDER — SODIUM CHLORIDE 0.9 % IV SOLN
5.0000 mg/kg | Freq: Once | INTRAVENOUS | Status: AC
  Administered 2024-02-20: 500 mg via INTRAVENOUS
  Filled 2024-02-20: qty 50

## 2024-02-20 MED ORDER — ACETAMINOPHEN 325 MG PO TABS: 650.0000 mg | ORAL_TABLET | Freq: Once | ORAL | Status: DC

## 2024-02-20 NOTE — Progress Notes (Signed)
 Diagnosis: Ulcerative Colitis  Provider:  Lonna Coder MD  Procedure: IV Infusion  IV Type: Peripheral, IV Location: L Forearm  Avsola  (infliximab -axxq), Dose: 500 mg  Infusion Start Time: 1135  Infusion Stop Time: 1350  Post Infusion IV Care: Peripheral IV Discontinued  Discharge: Condition: Good, Destination: Home . AVS Declined  Performed by:  Jemari Hallum, RN

## 2024-04-16 ENCOUNTER — Ambulatory Visit
